# Patient Record
Sex: Male | Born: 1970
Health system: Southern US, Community
[De-identification: ages and names within clinical notes are randomized; demographics above are authoritative.]

## PROBLEM LIST (undated history)

## (undated) DIAGNOSIS — E7800 Pure hypercholesterolemia, unspecified: Secondary | ICD-10-CM

## (undated) DIAGNOSIS — I472 Ventricular tachycardia: Secondary | ICD-10-CM

## (undated) DIAGNOSIS — I4729 Other ventricular tachycardia: Secondary | ICD-10-CM

## (undated) DIAGNOSIS — R778 Other specified abnormalities of plasma proteins: Secondary | ICD-10-CM

## (undated) DIAGNOSIS — R7989 Other specified abnormal findings of blood chemistry: Secondary | ICD-10-CM

## (undated) DIAGNOSIS — I422 Other hypertrophic cardiomyopathy: Secondary | ICD-10-CM

## (undated) HISTORY — PX: KNEE SURGERY: SHX244

---

## 2003-05-07 ENCOUNTER — Encounter: Payer: Self-pay | Admitting: Orthopedic Surgery

## 2003-05-07 ENCOUNTER — Ambulatory Visit (HOSPITAL_COMMUNITY): Admission: RE | Admit: 2003-05-07 | Discharge: 2003-05-07 | Payer: Self-pay | Admitting: Orthopedic Surgery

## 2004-03-06 ENCOUNTER — Emergency Department (HOSPITAL_COMMUNITY): Admission: EM | Admit: 2004-03-06 | Discharge: 2004-03-06 | Payer: Self-pay | Admitting: Family Medicine

## 2004-06-24 ENCOUNTER — Ambulatory Visit (HOSPITAL_COMMUNITY): Admission: RE | Admit: 2004-06-24 | Discharge: 2004-06-24 | Payer: Self-pay | Admitting: Emergency Medicine

## 2004-10-21 ENCOUNTER — Emergency Department (HOSPITAL_COMMUNITY): Admission: EM | Admit: 2004-10-21 | Discharge: 2004-10-21 | Payer: Self-pay | Admitting: Family Medicine

## 2005-11-11 ENCOUNTER — Emergency Department (HOSPITAL_COMMUNITY): Admission: EM | Admit: 2005-11-11 | Discharge: 2005-11-11 | Payer: Self-pay | Admitting: Family Medicine

## 2005-11-14 ENCOUNTER — Emergency Department (HOSPITAL_COMMUNITY): Admission: EM | Admit: 2005-11-14 | Discharge: 2005-11-14 | Payer: Self-pay | Admitting: Emergency Medicine

## 2009-05-08 ENCOUNTER — Encounter: Payer: Self-pay | Admitting: Internal Medicine

## 2011-05-13 ENCOUNTER — Inpatient Hospital Stay (INDEPENDENT_AMBULATORY_CARE_PROVIDER_SITE_OTHER)
Admission: RE | Admit: 2011-05-13 | Discharge: 2011-05-13 | Disposition: A | Discharge status: Home / Self Care | Payer: 59 | Source: Ambulatory Visit | Attending: Emergency Medicine | Admitting: Emergency Medicine

## 2011-05-13 DIAGNOSIS — J309 Allergic rhinitis, unspecified: Secondary | ICD-10-CM

## 2012-12-12 ENCOUNTER — Ambulatory Visit (HOSPITAL_COMMUNITY)
Admission: RE | Admit: 2012-12-12 | Discharge: 2012-12-12 | Disposition: A | Discharge status: Home / Self Care | Payer: 59 | Source: Ambulatory Visit | Attending: Sports Medicine | Admitting: Sports Medicine

## 2012-12-12 ENCOUNTER — Ambulatory Visit (INDEPENDENT_AMBULATORY_CARE_PROVIDER_SITE_OTHER): Payer: 59 | Admitting: Sports Medicine

## 2012-12-12 ENCOUNTER — Encounter: Payer: Self-pay | Admitting: Sports Medicine

## 2012-12-12 VITALS — BP 131/76 | HR 59 | Ht 68.5 in | Wt 150.0 lb

## 2012-12-12 DIAGNOSIS — M7062 Trochanteric bursitis, left hip: Secondary | ICD-10-CM

## 2012-12-12 DIAGNOSIS — M25559 Pain in unspecified hip: Secondary | ICD-10-CM | POA: Insufficient documentation

## 2012-12-12 DIAGNOSIS — M25552 Pain in left hip: Secondary | ICD-10-CM

## 2012-12-12 DIAGNOSIS — M76899 Other specified enthesopathies of unspecified lower limb, excluding foot: Secondary | ICD-10-CM

## 2012-12-12 NOTE — Progress Notes (Signed)
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 Present intermittently for 30 years. Started taekwondo 2 wks ago w/ pain worsened by lateral and high kicks. Wants to prevent worsening pain and any further issues. Hip typically hurts for about 1 hr after. No previous specific trauma other than a possible hard tackle in football at age 17. H/o gymnastics participation through college. He localizes his pain to the lateral hip. Denies groin pain. No low back pain.  Midfoot pain: started 2 wks ago in the arch after starting taekwondo. Runs indoors and barefoot on the wrestling mats to warm up. Running for 10 min. No other running. naprocin x1 w/o much benefit. Painful after run but resolved by morning. No trauma.    The following portions of the patient's history were reviewed and updated as appropriate: allergies, current medications and problem list.   Review of Systems ROS Per HPI otherwise negative.   Objective:    BP 131/76  Pulse 59  Ht 5' 8.5" (1.74 m)  Wt 150 lb (68.04 kg)  BMI 22.47 kg/m2 Gen: NAD, WNWD HEENT: MMM, EOMI SKin: warm, well perfused, intact Neuro: CN grossly intact. Ambulation w/o difficulty Left hip: Smooth painless hip range of motion with a negative log roll. There is tenderness to palpation over the left greater trochanter as well as a little more proximally in the tensor fascia lata. No soft tissue swelling. He has hip weakness bilaterally with resisted hip abduction. Fairly good strength with resisted hip flexion. Neurovascularly intact distally. Examination of each of his feet shows there to be minimal tenderness to palpation along the mid substance of the plantar fascia bilaterally. Her is no soft tissue swelling. There is no palpable fibroma. No tenderness to palpation at the calcaneal insertion. Neurovascularly intact distally. Walking without a limp.    Imaging: X-ray Ordered    ssessment:   1.  left hip pain secondary to greater trochanteric bursitis/proximal IT band tendinitis 2. Plantar fascial pain  Plan:   1. with questionable history of remote trauma we will order an x-ray of the left hip. 2. Home exercises (especially abductor strengthening) reviewed and to be performed Qday to BID 3. No restriction on activity 4. Consider Steroid injection of hip if no improvement. 5. Arch straps. I recommended be worn with tae kwon do. 6. I will call the patient after reviewing his x-rays. We'll followup formally prn. 6. F/u in 3 wks if no improvement or worsening.

## 2012-12-13 ENCOUNTER — Telehealth: Payer: Self-pay | Admitting: Sports Medicine

## 2012-12-13 NOTE — Telephone Encounter (Signed)
I spoke with Dr. Mayford Knife on the phone today regarding x-rays of his left hip. They're unremarkable. No evidence of degenerative changes or AVN. He we'll proceed with our treatment plan as discussed at his office visit yesterday.

## 2013-08-24 ENCOUNTER — Other Ambulatory Visit: Payer: Self-pay

## 2013-12-26 ENCOUNTER — Other Ambulatory Visit (HOSPITAL_COMMUNITY): Payer: Self-pay | Admitting: Orthopedic Surgery

## 2013-12-26 DIAGNOSIS — S46219A Strain of muscle, fascia and tendon of other parts of biceps, unspecified arm, initial encounter: Secondary | ICD-10-CM

## 2013-12-27 ENCOUNTER — Ambulatory Visit (HOSPITAL_COMMUNITY)
Admission: RE | Admit: 2013-12-27 | Discharge: 2013-12-27 | Disposition: A | Discharge status: Home / Self Care | Payer: 59 | Source: Ambulatory Visit | Attending: Orthopedic Surgery | Admitting: Orthopedic Surgery

## 2013-12-27 DIAGNOSIS — M25519 Pain in unspecified shoulder: Secondary | ICD-10-CM | POA: Insufficient documentation

## 2013-12-27 DIAGNOSIS — S46219A Strain of muscle, fascia and tendon of other parts of biceps, unspecified arm, initial encounter: Secondary | ICD-10-CM

## 2013-12-28 ENCOUNTER — Ambulatory Visit (HOSPITAL_COMMUNITY): Payer: 59

## 2014-01-09 ENCOUNTER — Ambulatory Visit: Payer: 59 | Attending: Orthopedic Surgery | Admitting: Physical Therapy

## 2014-01-09 DIAGNOSIS — M25519 Pain in unspecified shoulder: Secondary | ICD-10-CM | POA: Insufficient documentation

## 2014-01-09 DIAGNOSIS — IMO0001 Reserved for inherently not codable concepts without codable children: Secondary | ICD-10-CM | POA: Insufficient documentation

## 2014-01-16 ENCOUNTER — Ambulatory Visit: Payer: 59 | Admitting: Physical Therapy

## 2014-01-16 ENCOUNTER — Ambulatory Visit: Payer: 59 | Admitting: Rehabilitation

## 2014-01-24 ENCOUNTER — Ambulatory Visit: Payer: 59 | Attending: Orthopedic Surgery | Admitting: Rehabilitation

## 2014-01-24 DIAGNOSIS — M25519 Pain in unspecified shoulder: Secondary | ICD-10-CM | POA: Insufficient documentation

## 2014-01-24 DIAGNOSIS — IMO0001 Reserved for inherently not codable concepts without codable children: Secondary | ICD-10-CM | POA: Insufficient documentation

## 2014-02-01 ENCOUNTER — Ambulatory Visit: Payer: 59 | Admitting: Rehabilitation

## 2014-02-19 ENCOUNTER — Ambulatory Visit: Payer: 59 | Attending: Orthopedic Surgery | Admitting: Rehabilitation

## 2014-02-19 DIAGNOSIS — IMO0001 Reserved for inherently not codable concepts without codable children: Secondary | ICD-10-CM | POA: Insufficient documentation

## 2014-02-19 DIAGNOSIS — M25519 Pain in unspecified shoulder: Secondary | ICD-10-CM | POA: Insufficient documentation

## 2014-08-03 ENCOUNTER — Other Ambulatory Visit: Payer: Self-pay

## 2014-12-02 ENCOUNTER — Emergency Department (HOSPITAL_COMMUNITY)
Admission: EM | Admit: 2014-12-02 | Discharge: 2014-12-02 | Disposition: A | Discharge status: Home / Self Care | Payer: 59 | Source: Home / Self Care | Attending: Emergency Medicine | Admitting: Emergency Medicine

## 2014-12-02 ENCOUNTER — Encounter (HOSPITAL_COMMUNITY): Payer: Self-pay | Admitting: *Deleted

## 2014-12-02 DIAGNOSIS — M542 Cervicalgia: Secondary | ICD-10-CM

## 2014-12-02 HISTORY — DX: Pure hypercholesterolemia, unspecified: E78.00

## 2014-12-02 MED ORDER — CEPHALEXIN 500 MG PO CAPS: 500.0000 mg | ORAL_CAPSULE | Freq: Three times a day (TID) | ORAL | Status: DC

## 2014-12-02 MED ORDER — PREDNISONE 20 MG PO TABS: ORAL_TABLET | ORAL | Status: DC

## 2014-12-02 NOTE — ED Provider Notes (Signed)
   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 It's tender to touch. He thinks it might be a little bit swollen. He cannot palpate any mass, lump, or adenopathy. The patient had an ingrown hair in his left cheek a few days before this began. This is not going down. He denies any fever, chills, headache, stiff neck, sore throat, intraoral lesions, nasal congestion, or earache. There is no radiation the pain down his arms with no numbness, tingling, or weakness. He denies any chest pain, shortness of breath, or cough.  Review of Systems   Other than noted above, the patient denies any of the following symptoms: Constitutional:  No fever or chills. Neck:  No swelling, or adenopathy.   Cardiac:  No chest pain, tightness, or pressure. Respiratory:  No cough or dyspnea. M-S:  No joint pain, muscle pain, or back problems. Neuro:  No headache, muscle weakness, or paresthesias.  PMFSH   Past medical history, family history, social history, meds, and allergies were reviewed.    Physical Examination    Vital signs:  BP 138/79 mmHg  Pulse 55  Temp(Src) 97.8 F (36.6 C) (Oral)  Resp 14  SpO2 100% General:  Alert, oriented and in no distress. ENT:  Pharynx clear, no oral lesions. Neck:  He has exquisite tenderness to touch over the carotid bulb. Carotid pulsations are full bilaterally. There is no adenopathy or mass. Neck has a full range of motion.  Lungs:  No respiratory distress.  Breath sounds clear and equal bilaterally.  No wheezes, rales or rhonchi. Heart:  Regular rhythm.  No gallops, murmers, or rubs. Ext:  No upper extremity edema, pulses full.  Full ROM of joints with no joint or muscle pain to palpation. Neuro:  Alert and oriented times 3.  No focal muscle weakness.  DTRs  symmetric.  Sensation intact to light touch. Skin: Clear, warm and dry.  No rash.  Good capillary refill.  Assessment   The encounter diagnosis was Neck pain.  Differential diagnosis is carotid dyspnea versus lymphadenitis. I'll treat for both. I told him if this hasn't improved in 3 or 4 days the next step would be to get a neck CT.  Plan    1.  Meds:  The following meds were prescribed:   Discharge Medication List as of 12/02/2014  3:35 PM    START taking these medications   Details  cephALEXin (KEFLEX) 500 MG capsule Take 1 capsule (500 mg total) by mouth 3 (three) times daily., Starting 12/02/2014, Until Discontinued, Normal    predniSONE (DELTASONE) 20 MG tablet Take 3 daily for 5 days, 2 daily for 5 days, 1 daily for 5 days., Normal        2.  Patient Education/Counseling:  The patient was given appropriate handouts, self care instructions, and instructed in pain control.  Given exercises to do twice daily followed by moist heat.    3.  Follow up:  The patient was told to follow up here if no better in 3 to 4 days, or sooner if becoming worse in any way, and given some red flag symptoms such as worsening pain or new neurological symptoms which would prompt immediate return.       Reuben Likesavid C Shaianne Nucci, MD 12/02/14 [phone]

## 2014-12-02 NOTE — ED Notes (Signed)
Reports having had ingrown hair to left cheek approx 1.5 wks ago with improvement.  Today having a pinpoint area of tenderness to left lateral neck.  Pain significantly worse with any movement or palpation.  Denies fevers.

## 2014-12-02 NOTE — Discharge Instructions (Signed)
Cervical Adenitis °You have a swollen lymph gland in your neck. This commonly happens with Strep and virus infections, dental problems, insect bites, and injuries about the face, scalp, or neck. The lymph glands swell as the body fights the infection or heals the injury. Swelling and firmness typically lasts for several weeks after the infection or injury is healed. Rarely lymph glands can become swollen because of cancer or TB. °Antibiotics are prescribed if there is evidence of an infection. Sometimes an infected lymph gland becomes filled with pus. This condition may require opening up the abscessed gland by draining it surgically. Most of the time infected glands return to normal within two weeks. Do not poke or squeeze the swollen lymph nodes. That may keep them from shrinking back to their normal size. If the lymph gland is still swollen after 2 weeks, further medical evaluation is needed.  °SEEK IMMEDIATE MEDICAL CARE IF:  °You have difficulty swallowing or breathing, increased swelling, severe pain, or a high fever.  °Document Released: 10/05/2005 Document Revised: 12/28/2011 Document Reviewed: 03/27/2007 °ExitCare® Patient Information ©2015 [employer]. This information is not intended to replace advice given to you by your health care provider. Make sure you discuss any questions you have with your health care provider. ° °

## 2015-11-08 ENCOUNTER — Observation Stay (HOSPITAL_COMMUNITY)
Admission: EM | Admit: 2015-11-08 | Discharge: 2015-11-09 | Disposition: A | Discharge status: Home / Self Care | Payer: 59 | Attending: Internal Medicine | Admitting: Internal Medicine

## 2015-11-08 ENCOUNTER — Emergency Department (HOSPITAL_BASED_OUTPATIENT_CLINIC_OR_DEPARTMENT_OTHER): Payer: 59

## 2015-11-08 ENCOUNTER — Encounter (HOSPITAL_COMMUNITY): Payer: Self-pay

## 2015-11-08 ENCOUNTER — Emergency Department (HOSPITAL_COMMUNITY): Payer: 59

## 2015-11-08 DIAGNOSIS — I472 Ventricular tachycardia: Principal | ICD-10-CM | POA: Insufficient documentation

## 2015-11-08 DIAGNOSIS — E7800 Pure hypercholesterolemia, unspecified: Secondary | ICD-10-CM | POA: Diagnosis not present

## 2015-11-08 DIAGNOSIS — I442 Atrioventricular block, complete: Secondary | ICD-10-CM

## 2015-11-08 DIAGNOSIS — R079 Chest pain, unspecified: Secondary | ICD-10-CM | POA: Diagnosis not present

## 2015-11-08 DIAGNOSIS — R748 Abnormal levels of other serum enzymes: Secondary | ICD-10-CM | POA: Insufficient documentation

## 2015-11-08 DIAGNOSIS — I4729 Other ventricular tachycardia: Secondary | ICD-10-CM

## 2015-11-08 DIAGNOSIS — Z79899 Other long term (current) drug therapy: Secondary | ICD-10-CM | POA: Diagnosis not present

## 2015-11-08 DIAGNOSIS — R0602 Shortness of breath: Secondary | ICD-10-CM | POA: Diagnosis not present

## 2015-11-08 DIAGNOSIS — R9431 Abnormal electrocardiogram [ECG] [EKG]: Secondary | ICD-10-CM | POA: Diagnosis present

## 2015-11-08 DIAGNOSIS — R778 Other specified abnormalities of plasma proteins: Secondary | ICD-10-CM

## 2015-11-08 DIAGNOSIS — R0600 Dyspnea, unspecified: Secondary | ICD-10-CM

## 2015-11-08 DIAGNOSIS — I499 Cardiac arrhythmia, unspecified: Secondary | ICD-10-CM | POA: Diagnosis not present

## 2015-11-08 DIAGNOSIS — Z8249 Family history of ischemic heart disease and other diseases of the circulatory system: Secondary | ICD-10-CM | POA: Insufficient documentation

## 2015-11-08 DIAGNOSIS — I498 Other specified cardiac arrhythmias: Secondary | ICD-10-CM | POA: Insufficient documentation

## 2015-11-08 DIAGNOSIS — I422 Other hypertrophic cardiomyopathy: Secondary | ICD-10-CM | POA: Insufficient documentation

## 2015-11-08 DIAGNOSIS — R7989 Other specified abnormal findings of blood chemistry: Secondary | ICD-10-CM

## 2015-11-08 DIAGNOSIS — R55 Syncope and collapse: Secondary | ICD-10-CM

## 2015-11-08 HISTORY — DX: Other hypertrophic cardiomyopathy: I42.2

## 2015-11-08 HISTORY — DX: Other specified abnormal findings of blood chemistry: R79.89

## 2015-11-08 HISTORY — DX: Other specified abnormalities of plasma proteins: R77.8

## 2015-11-08 HISTORY — DX: Other ventricular tachycardia: I47.29

## 2015-11-08 HISTORY — DX: Ventricular tachycardia: I47.2

## 2015-11-08 LAB — I-STAT TROPONIN, ED: TROPONIN I, POC: 0.05 ng/mL (ref 0.00–0.08)

## 2015-11-08 LAB — BASIC METABOLIC PANEL
ANION GAP: 10 (ref 5–15)
BUN: 9 mg/dL (ref 6–20)
CALCIUM: 9.1 mg/dL (ref 8.9–10.3)
CO2: 27 mmol/L (ref 22–32)
CREATININE: 1.15 mg/dL (ref 0.61–1.24)
Chloride: 106 mmol/L (ref 101–111)
GFR calc Af Amer: >60 mL/min (ref >60)
Glucose, Bld: 93 mg/dL (ref 65–99)
Potassium: 3.7 mmol/L (ref 3.5–5.1)
Sodium: 143 mmol/L (ref 135–145)

## 2015-11-08 LAB — I-STAT CHEM 8, ED
BUN: 11 mg/dL (ref 6–20)
CHLORIDE: 104 mmol/L (ref 101–111)
CREATININE: 1.1 mg/dL (ref 0.61–1.24)
Calcium, Ion: 1.18 mmol/L (ref 1.12–1.23)
GLUCOSE: 79 mg/dL (ref 65–99)
HEMATOCRIT: 47 % (ref 39.0–52.0)
Hemoglobin: 16 g/dL (ref 13.0–17.0)
Potassium: 3.6 mmol/L (ref 3.5–5.1)
Sodium: 144 mmol/L (ref 135–145)
TCO2: 28 mmol/L (ref 0–100)

## 2015-11-08 LAB — CBC
HCT: 43.1 % (ref 39.0–52.0)
Hemoglobin: 14.8 g/dL (ref 13.0–17.0)
MCH: 30.4 pg (ref 26.0–34.0)
MCHC: 34.3 g/dL (ref 30.0–36.0)
MCV: 88.5 fL (ref 78.0–100.0)
Platelets: 225 10*3/uL (ref 150–400)
RBC: 4.87 MIL/uL (ref 4.22–5.81)
RDW: 12.7 % (ref 11.5–15.5)
WBC: 4.8 10*3/uL (ref 4.0–10.5)

## 2015-11-08 LAB — TROPONIN I
TROPONIN I: 0.11 ng/mL — AB (ref <0.031)
Troponin I: 0.1 ng/mL — ABNORMAL HIGH (ref <0.031)

## 2015-11-08 LAB — PHOSPHORUS: PHOSPHORUS: 3.4 mg/dL (ref 2.5–4.6)

## 2015-11-08 LAB — MAGNESIUM: Magnesium: 1.9 mg/dL (ref 1.7–2.4)

## 2015-11-08 MED ORDER — NITROGLYCERIN 0.4 MG SL SUBL: 0.4000 mg | SUBLINGUAL_TABLET | SUBLINGUAL | Status: DC | PRN

## 2015-11-08 MED ORDER — SODIUM CHLORIDE 0.9 % IJ SOLN: 3.0000 mL | Freq: Two times a day (BID) | INTRAMUSCULAR | Status: DC

## 2015-11-08 MED ORDER — NITROGLYCERIN 0.4 MG SL SUBL
0.4000 mg | SUBLINGUAL_TABLET | SUBLINGUAL | Status: DC | PRN
  Administered 2015-11-08: 0.4 mg via SUBLINGUAL
  Filled 2015-11-08: qty 1

## 2015-11-08 MED ORDER — ASPIRIN 81 MG PO CHEW
324.0000 mg | CHEWABLE_TABLET | ORAL | Status: DC
  Filled 2015-11-08: qty 4

## 2015-11-08 MED ORDER — SODIUM CHLORIDE 0.9 % IJ SOLN: 3.0000 mL | INTRAMUSCULAR | Status: DC | PRN

## 2015-11-08 MED ORDER — ASPIRIN EC 81 MG PO TBEC
81.0000 mg | DELAYED_RELEASE_TABLET | Freq: Every day | ORAL | Status: DC
  Administered 2015-11-09: 81 mg via ORAL

## 2015-11-08 MED ORDER — ASPIRIN 300 MG RE SUPP: 300.0000 mg | RECTAL | Status: DC

## 2015-11-08 MED ORDER — SODIUM CHLORIDE 0.9 % IV SOLN: 250.0000 mL | INTRAVENOUS | Status: DC | PRN

## 2015-11-08 MED ORDER — ASPIRIN 81 MG PO CHEW
324.0000 mg | CHEWABLE_TABLET | Freq: Once | ORAL | Status: AC
  Administered 2015-11-08: 324 mg via ORAL
  Filled 2015-11-08: qty 4

## 2015-11-08 MED ORDER — ONDANSETRON HCL 4 MG/2ML IJ SOLN: 4.0000 mg | Freq: Four times a day (QID) | INTRAMUSCULAR | Status: DC | PRN

## 2015-11-08 MED ORDER — ACETAMINOPHEN 325 MG PO TABS: 650.0000 mg | ORAL_TABLET | ORAL | Status: DC | PRN

## 2015-11-08 NOTE — ED Notes (Signed)
Pt reports left sided chest pain that is described as sharp and intermittent.  Pt reports when the chest pain hits he becomes lightheaded, nauseous and "like the flu is coming on".  Pt hooked himself to monitor upstairs and had a five beat run of v-tach.  Pt NSR at this time.

## 2015-11-08 NOTE — Progress Notes (Signed)
Echocardiogram 2D Echocardiogram has been performed.  11/08/2015 4:16 PM Gertie Fey, RVT, RDCS, RDMS

## 2015-11-08 NOTE — ED Provider Notes (Signed)
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 He's had some intermittent chest pain and he felt lightheaded.  He was upstairs today working when he felt the symptoms again. He hooked himself up to the monitor and noticed a 5 beat run of wide complex rhythm. He did not have any loss of consciousness. Denying any current trouble with any chest pain or shortness of breath.  Emergency for further evaluation Physical Exam  BP 133/88 mmHg  Pulse 58  Temp(Src) 98.2 F (36.8 C) (Oral)  Resp 18  Ht  (1.753 m)  Wt 68.04 kg  BMI 22.14 kg/m2  SpO2 100%  Physical Exam  Constitutional: He appears well-developed and well-nourished. No distress.  HENT:  Head: Normocephalic and atraumatic.  Right Ear: External ear normal.  Left Ear: External ear normal.  Eyes: Conjunctivae are normal. Right eye exhibits no discharge. Left eye exhibits no discharge. No scleral icterus.  Neck: Neck supple. No tracheal deviation present.  Cardiovascular: Normal rate.   Pulmonary/Chest: Effort normal. No stridor. No respiratory distress.  Musculoskeletal: He exhibits no edema.  Neurological: He is alert. Cranial nerve deficit: no gross deficits.  Skin: Skin is warm and dry. No rash noted.  Psychiatric: He has a normal mood and affect.  Nursing note and vitals reviewed.   ED Course  Procedures  EKG Interpretation  Date/Time:  Friday November 08 2015 14:08:18 EST Ventricular Rate:  60 PR Interval:  147 QRS Duration: 116 QT Interval:  413 QTC Calculation: 413 R Axis:   52 Text Interpretation:  Sinus rhythm Probable LVH with secondary repol abnrm No previous tracing Confirmed by Jericho Alcorn  MD-J, Darenda Fike ([id number]) on 11/08/2015 2:43:02 PM       MDM The rhythm strip was reviewed. Patient had a wide complex rhythm that was not tachycardic. I'm not certain of the exact rate but it seemed to be only slightly faster than his native sinus rhythm  of approx 60 bpm.    EKG shows nonspecific changes. Initial laboratory tests are unremarkable. Plan on cardiac consultation for further evaluation.      Linwood Dibbles, MD 11/08/15 [phone]

## 2015-11-08 NOTE — ED Provider Notes (Signed)
Care assumed at 3 PM. This is a 45 y/o male who presents with multiple PVCs which is symptomatic from. Cardiology consultation to evaluate the patient. Cards will admit to their service for observation and further testing. No acute events in the ER.  Silas Flood, MD 11/09/15 0003  Abelino Derrick, MD 11/09/15 [id number]

## 2015-11-08 NOTE — ED Provider Notes (Signed)
CSN: [id number]     Arrival date & time 11/08/15  1359 History   First MD Initiated Contact with Patient 11/08/15 1405     Chief Complaint  Patient presents with  . Chest Pain  . Irregular Heart Beat     (Consider location/radiation/quality/duration/timing/severity/associated sxs/prior Treatment) Patient is a 45 y.o. male presenting with chest pain. The history is provided by the patient.  Chest Pain Pain location:  L lateral chest Pain quality: sharp and stabbing   Pain radiates to:  Does not radiate Pain radiates to the back: no   Pain severity:  Mild Onset quality:  Sudden Duration:  3 days Timing:  Intermittent Progression:  Waxing and waning Chronicity:  New Context comment:  Playing basketball tues night. Relieved by:  None tried Worsened by:  Nothing tried Ineffective treatments:  None tried Associated symptoms: fatigue, nausea and shortness of breath   Associated symptoms: no altered mental status, no syncope and not vomiting   Associated symptoms comment:  Reports 1 day of diaphoresis, nausea, and some SOB Risk factors: male sex     Past Medical History  Diagnosis Date  . Hypercholesterolemia    Past Surgical History  Procedure Laterality Date  . Knee surgery     Family History  Problem Relation Age of Onset  . Heart disease Father   . Hypertension Mother   . Diabetes Father   . Hypertension Father   . Hypertension Sister    Social History  Substance Use Topics  . Smoking status: Never Smoker   . Smokeless tobacco: Never Used  . Alcohol Use: Yes     Comment: occasional    Review of Systems  Constitutional: Positive for fatigue.  HENT: Negative.   Eyes: Negative for visual disturbance.  Respiratory: Positive for shortness of breath.   Cardiovascular: Positive for chest pain. Negative for syncope.  Gastrointestinal: Positive for nausea. Negative for vomiting and diarrhea.  Genitourinary: Negative.   Musculoskeletal: Negative.   Skin: Negative.    Neurological: Negative for syncope.      Allergies  Review of patient's allergies indicates no known allergies.  Home Medications   Prior to Admission medications   Medication Sig Start Date End Date Taking? Authorizing Provider  atorvastatin (LIPITOR) 10 MG tablet Take 10 mg by mouth daily.   Yes Historical Provider, MD  fluticasone (FLONASE) 50 MCG/ACT nasal spray Place 2 sprays into both nostrils daily as needed for allergies or rhinitis.   Yes Historical Provider, MD   BP 142/82 mmHg  Pulse 62  Temp(Src) 97.8 F (36.6 C) (Oral)  Resp 18  Ht  (1.753 m)  Wt 68.04 kg  BMI 22.14 kg/m2  SpO2 100% Physical Exam  Constitutional: He is oriented to person, place, and time. He appears well-developed and well-nourished. No distress.  HENT:  Head: Normocephalic and atraumatic.  Mouth/Throat: No oropharyngeal exudate.  Eyes: Pupils are equal, round, and reactive to light. No scleral icterus.  Neck: Normal range of motion.  Cardiovascular: Normal rate, regular rhythm and intact distal pulses.  Exam reveals no gallop and no friction rub.   No murmur heard. Pulmonary/Chest: Effort normal. No respiratory distress. He has no wheezes. He has no rales.  Abdominal: Soft. He exhibits no distension. There is no tenderness. There is no rebound and no guarding.  Musculoskeletal: Normal range of motion. He exhibits no edema or tenderness.  Neurological: He is alert and oriented to person, place, and time. No cranial nerve deficit. He exhibits normal muscle tone.  Coordination normal.  Skin: Skin is warm and dry. No rash noted. He is not diaphoretic. No erythema. No pallor.  Psychiatric: He has a normal mood and affect.  Nursing note and vitals reviewed.   ED Course  Procedures (including critical care time) Labs Review Labs Reviewed  TROPONIN I - Abnormal; Notable for the following:    Troponin I 0.11 (*)    All other components within normal limits  TROPONIN I - Abnormal; Notable  for the following:    Troponin I 0.10 (*)    All other components within normal limits  CBC  MAGNESIUM  PHOSPHORUS  BASIC METABOLIC PANEL  LIPID PANEL  TROPONIN I  TROPONIN I  I-STAT CHEM 8, ED  I-STAT TROPOININ, ED    Imaging Review Dg Chest 2 View  11/08/2015  CLINICAL DATA:  Shortness of breath and PVCs for 24 hours. EXAM: CHEST  2 VIEW COMPARISON:  06/24/2004 FINDINGS: Normal heart size and mediastinal contours. No acute infiltrate or edema. No effusion or pneumothorax. No acute osseous findings. IMPRESSION: Negative chest. Electronically Signed   By: Marnee Spring M.D.   On: 11/08/2015 14:47   I have personally reviewed and evaluated these images and lab results as part of my medical decision-making.   EKG Interpretation   Date/Time:  Friday November 08 2015 14:08:18 EST Ventricular Rate:  60 PR Interval:  147 QRS Duration: 116 QT Interval:  413 QTC Calculation: 413 R Axis:   52 Text Interpretation:  Sinus rhythm Probable LVH with secondary repol abnrm  No previous tracing Confirmed by KNAPP  MD-J, JON ([id number]) on 11/08/2015  2:43:02 PM      MDM   Final diagnoses:  Cardiac arrhythmia, unspecified cardiac arrhythmia type  Near syncope    Patient is a 45 year old male with PMHx of hyperlipidemia who presents with 1 day of shortness of breath, diaphoresis, nausea, with some intermittent left-sided sharp chest pain of which has been going on since playing basketball on Tuesday night. 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 This was associated with presyncopal symptoms so he came to the ED for further evaluation. CBC, BMP, mag, and Phos, within normal limits. Troponin 0.05. Chest x-ray without acute findings. EKG with inferior twave inversions and ST depression in V4, V5, and V6 without any prior EKG for comparison. Cardiology consult. Will trend trop.   Patient care handed over to Dr. Edwyna Perfect at 5pm.  F/u trop trend. Likely admit to cardiology   Marijean Niemann, MD 11/08/15 3086  Linwood Dibbles, MD 11/09/15 [phone]

## 2015-11-08 NOTE — H&P (Signed)
Patient ID: Bradley Castaneda MRN: [id number], DOB/AGE: [birth year]   Admit date: 11/08/2015   Primary Physician: Dr Selena Batten Primary Cardiologist: New   WUJ:WJXBJ Bradley Castaneda is a 45 y.o. male with past medical history of HL who presents to [facility] ED on 11/08/2015 for chest pain, nausea, SOB and fatigue which started earlier today.  The patient reports he was playing basketball on Tuesday, 11/05/2015, and developed chest pain afterwards which was reproducible with palpation. He did not have any other symptoms at that time and thought it was likely of MSK etiology.  He plays regularly    Since then, he reports feeling fatigued for the past two days and felt "like he was getting the flu". 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 Last night he felt intermitt chest pains  Lasted seconds  This afternoon, he felt nauseated and had an episode of chest pain  Fleeting discomport. He reports the chest pain is a shooting pain, lasting 3-4 second intervals.  Also felt a little SOB  He connected himself to telemetry and had 5 beats of wide-complex tachycardia  And then 3 beat which he was able to capture on his phone.  In the ED, he reports having minor left-sided chest pain which is not reproducible  Last seconds  . He denies any other symptoms at this time.  Initial i-stat troponin is 0.05. Says he had an EKG and Echocardiogram as a child and 6+ years ago. Reports his EKG has always shown LVH and T-wave abnormalities. Done at Dr Reuel Derby office   Reports his father had CAD at an early age but is now deceased. ? Hypertrophic  Reports a family history of Type 2 DM and HTN, but neither have been diagnosed in him personally.    Home Medications  Prior to Admission medications   Medication Sig Start Date End Date Taking? Authorizing Provider  atorvastatin (LIPITOR) 10 MG tablet Take 10 mg by mouth daily.    Historical Provider, MD  cephALEXin (KEFLEX) 500 MG capsule Take 1 capsule  (500 mg total) by mouth 3 (three) times daily. 12/02/14   Reuben Likes, MD  predniSONE (DELTASONE) 20 MG tablet Take 3 daily for 5 days, 2 daily for 5 days, 1 daily for 5 days. 12/02/14   Reuben Likes, MD    Allergies No Known Allergies  Past Medical History Past Medical History  Diagnosis Date  . Hypercholesterolemia      Surgical History Past Surgical History  Procedure Laterality Date  . Knee surgery       Family History Family History  Problem Relation Age of Onset  . Heart disease Mother   . Hypertension Mother   . Diabetes Father   . Hypertension Father   . Hypertension Sister     Social History Social History   Social History  . Marital Status: Married    Spouse Name: N/A  . Number of Children: N/A  . Years of Education: N/A   Occupational History  . Not on file.   Social History Main Topics  . Smoking status: Never Smoker   . Smokeless tobacco: Never Used  . Alcohol Use: Yes     Comment: occasional  . Drug Use: No  . Sexual Activity: Not on file   Other Topics Concern  . Not on file   Social History Narrative     Review of Systems General:  No chills, fever, night sweats or weight  changes. Positive for fatigue. Cardiovascular:  No dyspnea on exertion, edema, orthopnea, palpitations, paroxysmal nocturnal dyspnea. Positive for chest pain. Dermatological: No rash, lesions/masses Respiratory: No cough, dyspnea Urologic: No hematuria, dysuria Abdominal:   No diarrhea, bright red blood per rectum, melena, or hematemesis. Positive for nausea and vomiting. Neurologic:  No visual changes, wkns, changes in mental status. All other systems reviewed and are otherwise negative except as noted above.   Physical Exam: Blood pressure 133/88, pulse 58, temperature 98.2 F (36.8 C), temperature source Oral, resp. rate 18, height  (1.753 m), weight 150 lb (68.04 kg), SpO2 100 %.  General: Well developed, well nourished African American,male appearing  in no acute distress. Head: Normocephalic, atraumatic, sclera non-icteric, no xanthomas, nares are without discharge. Dentition:  Neck: No carotid bruits. JVD not elevated.  Lungs: Respirations regular and unlabored, without wheezes or rales.  Heart: Regular rate and rhythm. No S3 or S4.  No murmur, no rubs, or gallops appreciated. Abdomen: Soft, non-tender, non-distended with normoactive bowel sounds. No hepatomegaly. No rebound/guarding. No obvious abdominal masses. Msk:  Strength and tone appear normal for age. No joint deformities or effusions. Extremities: No clubbing or cyanosis. No edema.  Distal pedal pulses are 2+ bilaterally. Neuro: Alert and oriented X 3. Moves all extremities spontaneously. No focal deficits noted. Psych:  Responds to questions appropriately with a normal affect. Skin: No rashes or lesions noted   Labs Lab Results  Component Value Date   WBC 4.8 11/08/2015   HGB 16.0 11/08/2015   HCT 47.0 11/08/2015   MCV 88.5 11/08/2015   PLT 225 11/08/2015    Recent Labs Lab 11/08/15 1427  NA 144  K 3.6  CL 104  BUN 11  CREATININE 1.10  GLUCOSE 79   Troponin (Point of Care Test)  Recent Labs  11/08/15 1425  TROPIPOC 0.05     ECG: NSR, HR 60. Inferior T-wave inversion. ST depression in V4, V5, and V6.  Echo: Pending  Radiology/Studies: Dg Chest 2 View: 11/08/2015  CLINICAL DATA:  Shortness of breath and PVCs for 24 hours. EXAM: CHEST  2 VIEW COMPARISON:  06/24/2004 FINDINGS: Normal heart size and mediastinal contours. No acute infiltrate or edema. No effusion or pneumothorax. No acute osseous findings. IMPRESSION: Negative chest. Electronically Signed   By: Marnee Spring M.D.   On: 11/08/2015 14:47    ASSESSMENT AND PLAN:   1. Chest Pain in setting of abnormal EKG - patient developed episode of chest pain earlier today, associated with nausea and fatigue.  Atypical  Fleeting  Reports his pain is a shooting pain which lasts several seconds. Did record 8  beats of NSVT on his cell phone after connecting himself to telemetry. - EKG shows inferior TWI and ST depression in V4, V5, and V6.  Has been abnormal for years  Patient reports a history of abnormal EKG's with LVH and T-wave abnormalities. Attempted to contact his PCP (Dr Selena Batten) but last EKG was 8 years ago and was in a paper chart, not available for review. - initial troponin negative. Repeat value pending.   Echo shows normal LV systolic function with apical hypertrophy   Signed, Ellsworth Lennox, PA-C 11/08/2015, 3:05 PM Pager: [phone]  Pt seen and examined  I have reviewed echo with colleagues Evid of apical hypertrophy I have reviewed case with Odessa Fleming.  He will plan to see the pt tomorrow Will continue telemetry  Cycle enzymes though I am not convinced of acute ischemia With baseline bradycardia pt  will prob not tolerate b blocker. Pt agreeable with above plan.  Dietrich Pates

## 2015-11-09 ENCOUNTER — Encounter (HOSPITAL_COMMUNITY): Payer: Self-pay | Admitting: Internal Medicine

## 2015-11-09 ENCOUNTER — Observation Stay (HOSPITAL_COMMUNITY): Payer: 59

## 2015-11-09 DIAGNOSIS — R079 Chest pain, unspecified: Secondary | ICD-10-CM | POA: Diagnosis not present

## 2015-11-09 DIAGNOSIS — I499 Cardiac arrhythmia, unspecified: Secondary | ICD-10-CM

## 2015-11-09 DIAGNOSIS — I442 Atrioventricular block, complete: Secondary | ICD-10-CM

## 2015-11-09 DIAGNOSIS — Z79899 Other long term (current) drug therapy: Secondary | ICD-10-CM | POA: Diagnosis not present

## 2015-11-09 DIAGNOSIS — I472 Ventricular tachycardia: Secondary | ICD-10-CM | POA: Diagnosis not present

## 2015-11-09 DIAGNOSIS — E7800 Pure hypercholesterolemia, unspecified: Secondary | ICD-10-CM | POA: Diagnosis not present

## 2015-11-09 DIAGNOSIS — I498 Other specified cardiac arrhythmias: Secondary | ICD-10-CM | POA: Diagnosis not present

## 2015-11-09 DIAGNOSIS — I422 Other hypertrophic cardiomyopathy: Secondary | ICD-10-CM | POA: Diagnosis not present

## 2015-11-09 DIAGNOSIS — R7989 Other specified abnormal findings of blood chemistry: Secondary | ICD-10-CM

## 2015-11-09 DIAGNOSIS — Z8249 Family history of ischemic heart disease and other diseases of the circulatory system: Secondary | ICD-10-CM | POA: Diagnosis not present

## 2015-11-09 DIAGNOSIS — R748 Abnormal levels of other serum enzymes: Secondary | ICD-10-CM | POA: Diagnosis not present

## 2015-11-09 DIAGNOSIS — R778 Other specified abnormalities of plasma proteins: Secondary | ICD-10-CM

## 2015-11-09 LAB — LIPID PANEL
CHOL/HDL RATIO: 3.1 ratio
Cholesterol: 147 mg/dL (ref 0–200)
HDL: 47 mg/dL (ref >40)
LDL CALC: 81 mg/dL (ref 0–99)
Triglycerides: 94 mg/dL (ref <150)
VLDL: 19 mg/dL (ref 0–40)

## 2015-11-09 LAB — TROPONIN I
TROPONIN I: 0.09 ng/mL — AB (ref <0.031)
TROPONIN I: 0.14 ng/mL — AB (ref <0.031)
Troponin I: 0.1 ng/mL — ABNORMAL HIGH (ref <0.031)

## 2015-11-09 LAB — BASIC METABOLIC PANEL
Anion gap: 8 (ref 5–15)
BUN: 9 mg/dL (ref 6–20)
CALCIUM: 9 mg/dL (ref 8.9–10.3)
CO2: 28 mmol/L (ref 22–32)
CREATININE: 1.25 mg/dL — AB (ref 0.61–1.24)
Chloride: 107 mmol/L (ref 101–111)
GFR calc Af Amer: >60 mL/min (ref >60)
GLUCOSE: 92 mg/dL (ref 65–99)
Potassium: 3.9 mmol/L (ref 3.5–5.1)
SODIUM: 143 mmol/L (ref 135–145)

## 2015-11-09 LAB — MAGNESIUM: MAGNESIUM: 1.8 mg/dL (ref 1.7–2.4)

## 2015-11-09 MED ORDER — IOHEXOL 350 MG/ML SOLN
100.0000 mL | Freq: Once | INTRAVENOUS | Status: AC | PRN
  Administered 2015-11-09: 100 mL via INTRAVENOUS

## 2015-11-09 MED ORDER — ASPIRIN 81 MG PO TBEC: 81.0000 mg | DELAYED_RELEASE_TABLET | Freq: Every day | ORAL | Status: DC

## 2015-11-09 NOTE — Progress Notes (Signed)
Pt. Discharged to home  Discharge information reviewed and given All personal belongings have been taken by patient Education discussed and reviewed IV was d/c and intact upon removal Tele d/c and CCMD notified Delonna Ney 5:02 PM

## 2015-11-09 NOTE — Discharge Summary (Signed)
Discharge Summary    Patient ID: Bradley Castaneda,  MRN: [id number], DOB/AGE: 1971/07/11 45 y.o.  Admit date: 11/08/2015 Discharge date: 11/09/2015  Primary Care Provider: No PCP Per Patient Primary Cardiologist: Dr. Suzan Nailer  Discharge Diagnoses    Active Problems:   NSVT (nonsustained ventricular tachycardia) (HCC)   Chest pain   Apical variant hypertrophic cardiomyopathy (HCC)   AIVR (accelerated idioventricular rhythm)   Elevated troponin   Allergies No Known Allergies  Diagnostic Studies/Procedures    2D echo 11/08/15 -- Left ventricle: Apical hypertrophy. The cavity size was normal. Wall thickness was normal. Systolic function was normal. Theestimated ejection fraction was in the range of 55% to 60%. Doppler parameters are consistent with abnormal left ventricular relaxation (grade 1 diastolic dysfunction). 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 He had also self-recorded episodes of WCT.  Hospital Course     See H&P for further details - had felt fatigued for 2 days as if he was getting the flu, atypical fleeting shooting chest pain lasting seconds, and mild SOB. He connected himself to telemetry and saw 5 beats of a WCT. He then had a 3 beat run that he was able to capture on his phone. He reports his EKG has always shown LVH and T-wave abnormalities, and he apparently had an EKG/echo as a child and 6 years ago. Due to the arrhythmia he presented to the ER. Troponins were mildly elevated in a flat trend - 0.11-0.10-0.10-0.9-0.14. K was 3.6->3.9, Mg 1.9. He was admitted for further eval. Echo 11/08/15 showed apical hypertrophy, EF 55-60%, grade 1 DD. While on telemetry he had a 6 beat run of nonsustained VT, AIVR. Dr. Graciela Husbands recommended CTA to evaluate for PE as well as calcium scoring. CTA  was negative for PE, pericardial effusion or dissection. Findings were compatible with hypertrophic cardiomyopathy. Although calcium scoring is not available on the weekend, the study was reviewed by Dr. Royann Shivers who saw essentially no/low calcium by this study. The [facility] overread was benign as well. Dr. Graciela Husbands has recommended outpatient cardiac MR and stress testing to look for other risk factors as well as genetic testing. Please see his consult note for full discussion. The patient was started on aspirin and continued on home meds of atorvastatin and flonase. He was not started on BB due to bradycardia. I have sent a message to the EP scheduler to help arrange these things. Dr. Graciela Husbands and Dr. Eldridge Dace have seen and examined the patient today and feels he is stable for discharge.  _____________  Discharge Vitals Blood pressure 107/67, pulse 68, temperature 98.5 F (36.9 C), temperature source Oral, resp. rate 18, height  (1.753 m), weight 150 lb (68.04 kg), SpO2 100 %.  Filed Weights   11/08/15 1418  Weight: 150 lb (68.04 kg)    Labs & Radiologic Studies     CBC  Recent Labs  11/08/15 1415 11/08/15 1427  WBC 4.8  --   HGB 14.8 16.0  HCT 43.1 47.0  MCV 88.5  --   PLT 225  --    Basic Metabolic Panel  Recent Labs  11/08/15 1415  11/08/15 1909 11/09/15 0643  NA  --   < > 143 143  K  --   < > 3.7 3.9  CL  --   < >  106 107  CO2  --   --  27 28  GLUCOSE  --   < > 93 92  BUN  --   < > 9 9  CREATININE  --   < > 1.15 1.25*  CALCIUM  --   --  9.1 9.0  MG 1.9  --   --  1.8  PHOS 3.4  --   --   --   < > = values in this interval not displayed. Cardiac Enzymes  Recent Labs  11/09/15 0034 11/09/15 0643 11/09/15 1446  TROPONINI 0.10* 0.09* 0.14*   Fasting Lipid Panel  Recent Labs  11/09/15 0034  CHOL 147  HDL 47  LDLCALC 81  TRIG 94  CHOLHDL 3.1   Dg Chest 2 View  11/08/2015  CLINICAL DATA:  Shortness of breath and PVCs for 24 hours. EXAM: CHEST   2 VIEW COMPARISON:  06/24/2004 FINDINGS: Normal heart size and mediastinal contours. No acute infiltrate or edema. No effusion or pneumothorax. No acute osseous findings. IMPRESSION: Negative chest. Electronically Signed   By: Marnee Spring M.D.   On: 11/08/2015 14:47   Ct Angio Chest Pe W/cm &/or Wo Cm  11/09/2015  ADDENDUM REPORT: 11/09/2015 13:38 ADDENDUM: There is no convincing pulmonary embolism seen within the main, lobar or central segmental pulmonary arteries. Some of the most peripheral segmental and subsegmental pulmonary arteries are difficult to definitively characterize due to patient breathing motion artifact. Electronically Signed   By: Bary Richard M.D.   On: 11/09/2015 13:38  11/09/2015  CLINICAL DATA:  Hypertrophic cardiomyopathy, vague chest pain is EXAM: CT ANGIOGRAPHY CHEST WITH CONTRAST TECHNIQUE: Multidetector CT imaging of the chest was performed using the standard protocol during bolus administration of intravenous contrast. Multiplanar CT image reconstructions and MIPs were obtained to evaluate the vascular anatomy. CONTRAST:  OMNIPAQUE IOHEXOL 350 MG/ML SOLN COMPARISON:  None. FINDINGS: Characterization of the left ventricular wall is suboptimal due to lack of gating, but there does appear to be thickening of the left ventricular walls, interventricular wall measuring 1.8 cm thickness and left ventricular apex measuring up to 2.6 cm. Overall heart size is normal.  No pericardial effusion. Thoracic aorta is normal in caliber and configuration. No aortic aneurysm or dissection. No mass or enlarged lymph nodes within the mediastinum or perihilar regions. Lungs are clear. No pleural effusion. No pneumothorax. Trachea and central bronchi are unremarkable. Limited images of the upper abdomen are normal. No osseous abnormality seen. Superficial soft tissues are unremarkable. Review of the MIP images confirms the above findings. IMPRESSION: 1. Findings compatible with the given  history of hypertrophic cardiomyopathy, left ventricular wall measurements provided above, study limitations detailed above. Would consider more definitive characterization with echocardiogram or cardiac MRI. 2. Overall heart size is normal.  No pericardial effusion. 3. Lungs are clear. Electronically Signed: By: Bary Richard M.D. On: 11/09/2015 11:57   Ct Cardiac Scoring  11/09/2015  EXAM: OVER-READ INTERPRETATION  CT CHEST The following report is an over-read performed by radiologist Dr. Lavell Anchors [facility] on 11/09/2015. This over-read does not include interpretation of cardiac or coronary anatomy or pathology. The coronary calcium score/coronary CTA interpretation by the cardiologist is attached. COMPARISON:  None. FINDINGS: Heart size is normal. No pericardial effusion. Visualized portion of the thoracic aorta is normal in caliber and configuration. Visualized portions of the lungs are clear. Central bronchi are unremarkable. Visualized osseous structures are unremarkable. Limited images of the upper abdomen are unremarkable. IMPRESSION: No acute findings.  Electronically Signed   By: Bary Richard M.D.   On: 11/09/2015 12:37    Disposition   Pt is being discharged home today in good condition.  Follow-up Plans & Appointments    Follow-up Information    Follow up with Sherryl Manges, MD.   Specialty:  Cardiology   Why:  Office will call you for your followup appointment and additional studies. Call office if you have not heard back within 3 days.   Contact information:   [address] [place] [address] [phone]      Discharge Instructions    Diet - low sodium heart healthy    Complete by:  As directed      Increase activity slowly    Complete by:  As directed            Discharge Medications   Current Discharge Medication List    START taking these medications   Details  aspirin EC 81 MG EC tablet Take 1 tablet (81 mg total) by  mouth daily.      CONTINUE these medications which have NOT CHANGED   Details  atorvastatin (LIPITOR) 10 MG tablet Take 10 mg by mouth daily.    fluticasone (FLONASE) 50 MCG/ACT nasal spray Place 2 sprays into both nostrils daily as needed for allergies or rhinitis.         Outstanding Labs/Studies   N/A  Duration of Discharge Encounter   Greater than 30 minutes including physician time.  Signed, Tacey Ruiz Dunn PA-C 11/09/2015, 4:40 PM     I have examined the patient and reviewed assessment and plan and discussed with patient.  Agree with above as stated.  Discussed CT findings with the patient.  He has no PE and no significant coronary calcification.  Low HR prevents beta blocker use.  We discussed antiarrhythmics but at this point, such meds are not indicated.  He will have an OP cardiac MRI and OP stress test.  Office to call on Monday.     Benjie Ricketson S.

## 2015-11-09 NOTE — Consult Note (Signed)
ELECTROPHYSIOLOGY CONSULT NOTE  Patient ID: Bradley Castaneda, MRN: [id number], DOB/AGE: 05-27-1971 45 y.o. Admit date: 11/08/2015 Date of Consult: 11/09/2015  Primary Physician: No PCP Per Patient Primary Cardiologist: PR  Chief Complaint: VT and apical HCM     HPI Bradley Castaneda is a 45 y.o. male  Whom I'm asked to see after he presented yesterday with nonspecific symptoms and who had on his own behalf recorded AIVR and by echo was shown to have atypical HCM in the context of a family history  Thursday evening he began having this vague sensation of something not being quite right. There were vague chest pains. He was sometimes unaware of a difficulty taking a deep breath.He had this i to what you call up all night. This sensation was fairly constant permeating the next 24-36 hours although variablyevident. 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  He had played basketball on Tuesday for the first time in a long time. He had significant aches andrubbery in th in his legs. He also has some vague chest discomfort which was aggravated by palpation.  Yesterday because of the AIVR noted on his monitor he took himself to the emergency room where he was seen by Dr. Mardene Speak. He was admitted overnight. His troponins were elevated at 0.10 and have remained constant and elevated for the last 18 hours.  He denies a history of exertional chest discomfort.  He has had 2 episodes of syncope; they were both remote. Both of them occurredNext context of urination.  There is no family history of sudden death. While his father carries a diagnosis of IHSS, he died of sepsis/shock  To the best of his knowledge his siblings have not been screened for hypertrophic heart disease in many years. He himself was echoed decades ago     Past Medical History  Diagnosis Date  . Hypercholesterolemia   . NSVT (nonsustained  ventricular tachycardia) (HCC)   . Apical variant hypertrophic cardiomyopathy [facility])       Surgical History:  Past Surgical History  Procedure Laterality Date  . Knee surgery       Home Meds: Prior to Admission medications   Medication Sig Start Date End Date Taking? Authorizing Provider  atorvastatin (LIPITOR) 10 MG tablet Take 10 mg by mouth daily.   Yes Historical Provider, MD  fluticasone (FLONASE) 50 MCG/ACT nasal spray Place 2 sprays into both nostrils daily as needed for allergies or rhinitis.   Yes Historical Provider, MD    Inpatient Medications:  . aspirin  324 mg Oral NOW   Or  . aspirin  300 mg Rectal NOW  . aspirin EC  81 mg Oral Daily     Allergies: No Known Allergies  Social History   Social History  . Marital Status: Married    Spouse Name: N/A  . Number of Children: N/A  . Years of Education: N/A   Occupational History  . Not on file.   Social History Main Topics  . Smoking status: Never Smoker   . Smokeless tobacco: Never Used  . Alcohol Use: Yes     Comment: occasional  . Drug Use: No  . Sexual Activity: Not on file   Other Topics Concern  . Not on file   Social History Narrative     Family History  Problem Relation Age of Onset  . Heart disease Father     HCM-IHSS  .  Hypertension Mother   . Diabetes Father   . Hypertension Father   . Hypertension Sister   . CAD Mother      ROS:  Please see the history of present illness.     All other systems reviewed and negative.    Physical Exam:   Blood pressure 108/64, pulse 48, temperature 97.8 F (36.6 C), temperature source Oral, resp. rate 16, height  (1.753 m), weight 150 lb (68.04 kg), SpO2 100 %. General: Well developed, well nourished male in no acute distress. Head: Normocephalic, atraumatic, sclera non-icteric, no xanthomas, nares are without discharge. EENT: normal Lymph Nodes:  none Back: without scoliosis/kyphosis , no CVA tendersness Neck: Negative for carotid bruits.  JVD not elevated. Lungs: Clear bilaterally to auscultation without wheezes, rales, or rhonchi. Breathing is unlabored. Heart: RRR with S1 S2. No  murmur , rubs, or gallops appreciated. PMI prominent with double impulse Abdomen: Soft, non-tender, non-distended with normoactive bowel sounds. No hepatomegaly. No rebound/guarding. No obvious abdominal masses. Msk:  Strength and tone appear normal for age. Extremities: No clubbing or cyanosis. No edema.  Distal pedal pulses are 2+ and equal bilaterally. Skin: Warm and Dry Neuro: Alert and oriented X 3. CN III-XII intact Grossly normal sensory and motor function . Psych:  Responds to questions appropriately with a normal affect.      Labs: Cardiac Enzymes  Recent Labs  11/08/15 1624 11/08/15 1909 11/09/15 0034 11/09/15 0643  TROPONINI 0.11* 0.10* 0.10* 0.09*   CBC Lab Results  Component Value Date   WBC 4.8 11/08/2015   HGB 16.0 11/08/2015   HCT 47.0 11/08/2015   MCV 88.5 11/08/2015   PLT 225 11/08/2015   PROTIME: No results for input(s): LABPROT, INR in the last 72 hours. Chemistry   Recent Labs Lab 11/09/15 0643  NA 143  K 3.9  CL 107  CO2 28  BUN 9  CREATININE 1.25*  CALCIUM 9.0  GLUCOSE 92   Lipids Lab Results  Component Value Date   CHOL 147 11/09/2015   HDL 47 11/09/2015   LDLCALC 81 11/09/2015   TRIG 94 11/09/2015   BNP No results found for: PROBNP Thyroid Function Tests: No results for input(s): TSH, T4TOTAL, T3FREE, THYROIDAB in the last 72 hours.  Invalid input(s): FREET3    Miscellaneous No results found for: DDIMER  Radiology/Studies:  Dg Chest 2 View  11/08/2015  CLINICAL DATA:  Shortness of breath and PVCs for 24 hours. EXAM: CHEST  2 VIEW COMPARISON:  06/24/2004 FINDINGS: Normal heart size and mediastinal contours. No acute infiltrate or edema. No effusion or pneumothorax. No acute osseous findings. IMPRESSION: Negative chest. Electronically Signed   By: Marnee Spring M.D.   On:  11/08/2015 14:47    EKG: SR with ST depresion and minor T wave changes Tele VT NS 160 bpm AIVR   Assessment and Plan:  VTNS  APICAL HCM  DYSPNEA   Sensation of impending something   The patient has 2 parallel issues, The first is the cause of his current symptom complex iIn the context of a mildly persistent elevated troponin and the second is the awareness of apical HCM  with nonsustained ventricular tachycardia.  The symptom complex is Nonspecific. He has awareness of difficulty breathing and atypical chest pains. With his positive troponin, while unlikely we will exclude pulmonary embolism. We will do CT protocol. In addition, at the same time, will undertake a calcium score to exclude the presence of obstructive coronary disease which he should be able  to do with high sensitivity in this man  The issues of his HCM require further elucidation. Risk factors for sudden death include, in addition to his nonsustained ventricular tachycardia, wall thickness, syncope, family history of sudden death, and vasomotor depression with exercise. We also worry more significantly in patients  Who have gadolinium enhancement on MR imaging.  Furthermore, he has 3 childrenand 3 siblings. Genetic evaluation is appropriate especially given the diagnosis of his father. Gene testing would give [place] the most sensitive and specific tool for probingfor the family.  He is asked what his immediate risk of sudden death is. The presence of nonsustained VT is seen in 15-30% of patients with HCM. Thus, it is not a specific marker of risk. Overall studies is suggested that the risks are probably in the range of 1-2% at the upper limit hence, in the short-termI think the risks are very low. The patient expresses the concern that his wife understandably is quite anxious.If necessary LifeVest can be utilized.  His bradycardia precludes beta-blockade  For now, we will 1 undertake CT scanning to exclude pulmonary embolism  as well as coronary artery calcification 2 repeat the troponin to make sure the trend is flat and thus nondiagnostic 3  anticipate cardiac MR and outpatient stress testing to look for other risk factors 4-will anticipate genetic screening so as to be able to better characterize the[facility] 5 anticipate discharge this afternoon         Sherryl Manges

## 2015-11-09 NOTE — Progress Notes (Signed)
Pt asymptomatic and resting, pt denies pain or SOB, pt had 6 beat run of ventricular tachycardia, MD notified and ordered labs for a basic metabolic panel and magnesium for this morning, RN will continue to monitor, pt call bell within reach and bed in lowest position.     Mila Palmer, RN 11/09/2015

## 2015-11-11 ENCOUNTER — Other Ambulatory Visit (INDEPENDENT_AMBULATORY_CARE_PROVIDER_SITE_OTHER): Payer: 59 | Admitting: *Deleted

## 2015-11-11 ENCOUNTER — Other Ambulatory Visit: Payer: Self-pay | Admitting: *Deleted

## 2015-11-11 ENCOUNTER — Ambulatory Visit (INDEPENDENT_AMBULATORY_CARE_PROVIDER_SITE_OTHER): Payer: 59

## 2015-11-11 DIAGNOSIS — R002 Palpitations: Secondary | ICD-10-CM | POA: Diagnosis not present

## 2015-11-11 DIAGNOSIS — I4729 Other ventricular tachycardia: Secondary | ICD-10-CM

## 2015-11-11 DIAGNOSIS — R079 Chest pain, unspecified: Secondary | ICD-10-CM

## 2015-11-11 DIAGNOSIS — I422 Other hypertrophic cardiomyopathy: Secondary | ICD-10-CM

## 2015-11-11 DIAGNOSIS — I472 Ventricular tachycardia: Secondary | ICD-10-CM

## 2015-11-11 LAB — TROPONIN I: Troponin I: 0.06 ng/mL — ABNORMAL HIGH (ref <0.031)

## 2015-11-11 NOTE — Addendum Note (Signed)
Addended by: Tonita Phoenix on: 11/11/2015 02:31 PM   Modules accepted: Orders

## 2015-11-14 ENCOUNTER — Ambulatory Visit (HOSPITAL_COMMUNITY)
Admission: RE | Admit: 2015-11-14 | Discharge: 2015-11-14 | Disposition: A | Discharge status: Home / Self Care | Payer: 59 | Source: Ambulatory Visit | Attending: Internal Medicine | Admitting: Internal Medicine

## 2015-11-14 DIAGNOSIS — R002 Palpitations: Secondary | ICD-10-CM | POA: Insufficient documentation

## 2015-11-14 DIAGNOSIS — R079 Chest pain, unspecified: Secondary | ICD-10-CM | POA: Insufficient documentation

## 2015-11-14 DIAGNOSIS — I472 Ventricular tachycardia: Secondary | ICD-10-CM | POA: Insufficient documentation

## 2015-11-14 DIAGNOSIS — I422 Other hypertrophic cardiomyopathy: Secondary | ICD-10-CM | POA: Diagnosis not present

## 2015-11-14 DIAGNOSIS — I517 Cardiomegaly: Secondary | ICD-10-CM | POA: Insufficient documentation

## 2015-11-14 DIAGNOSIS — I4729 Other ventricular tachycardia: Secondary | ICD-10-CM

## 2015-11-14 MED ORDER — GADOBENATE DIMEGLUMINE 529 MG/ML IV SOLN
22.0000 mL | Freq: Once | INTRAVENOUS | Status: AC | PRN
  Administered 2015-11-14: 22 mL via INTRAVENOUS

## 2015-11-21 ENCOUNTER — Ambulatory Visit (INDEPENDENT_AMBULATORY_CARE_PROVIDER_SITE_OTHER): Payer: 59 | Admitting: Internal Medicine

## 2015-11-21 ENCOUNTER — Encounter (INDEPENDENT_AMBULATORY_CARE_PROVIDER_SITE_OTHER): Payer: 59

## 2015-11-21 DIAGNOSIS — I472 Ventricular tachycardia: Secondary | ICD-10-CM

## 2015-11-21 DIAGNOSIS — I422 Other hypertrophic cardiomyopathy: Secondary | ICD-10-CM | POA: Diagnosis not present

## 2015-11-21 DIAGNOSIS — I4729 Other ventricular tachycardia: Secondary | ICD-10-CM

## 2015-11-21 NOTE — Progress Notes (Signed)
Pt submitted for treadmill for risk stratification for HCM  Normal to hypertensive response Reviewed family issues with pt and his wife, re gene testing  They would like to porceed  They will continue to discuss ICD   We have reviewed SICD VS transvenous

## 2015-11-26 MED FILL — ATORVASTATIN 10 MG TABLET: 10 | 90 days supply | Qty: 90 | Fill #2

## 2015-11-30 LAB — EXERCISE TOLERANCE TEST
CHL CUP MPHR: 174 {beats}/min
Rest HR: 56 {beats}/min

## 2015-12-11 DIAGNOSIS — H5213 Myopia, bilateral: Secondary | ICD-10-CM | POA: Diagnosis not present

## 2015-12-12 ENCOUNTER — Telehealth: Payer: Self-pay | Admitting: Internal Medicine

## 2015-12-12 NOTE — Telephone Encounter (Signed)
New message  Please return call regarding this patient.

## 2016-01-30 DIAGNOSIS — I422 Other hypertrophic cardiomyopathy: Secondary | ICD-10-CM | POA: Diagnosis not present

## 2016-01-30 DIAGNOSIS — E785 Hyperlipidemia, unspecified: Secondary | ICD-10-CM | POA: Diagnosis not present

## 2016-02-27 ENCOUNTER — Telehealth: Payer: Self-pay | Admitting: Internal Medicine

## 2016-02-27 NOTE — Telephone Encounter (Signed)
New Message  Has to coordinate a blood draw HCM testing. Please call back to discuss

## 2016-02-27 NOTE — Telephone Encounter (Signed)
Lm to cb.

## 2016-03-02 MED FILL — ATORVASTATIN 10 MG TABLET: 10 | 90 days supply | Qty: 90 | Fill #0

## 2016-03-02 NOTE — Telephone Encounter (Signed)
Pt calling to arrange gene testing for HCM. Pt advised I will ask Herbert SetaHeather to follow up with him when she returns to the office tomorrow.  Pt gave me his cell phone number, [phone], and asked that she contact him tomorrow at that number.

## 2016-03-02 NOTE — Telephone Encounter (Signed)
LMTCB

## 2016-03-02 NOTE — Telephone Encounter (Signed)
Follow up ° ° ° ° ° °Returning a call to the nurse °

## 2016-03-03 NOTE — Telephone Encounter (Signed)
I left a message for the patient to call. 

## 2016-03-09 ENCOUNTER — Other Ambulatory Visit: Payer: 59

## 2016-03-09 NOTE — Telephone Encounter (Signed)
I spoke with the patient this morning. He did come and get his labs drawn this afternoon for genetic testing. He had to take the paperwork with him to fill out. He will bring this back tomorrow.

## 2016-03-09 NOTE — Telephone Encounter (Signed)
I spoke with the patient. He will try to come today ~4:30 pm to have his labs drawn.

## 2016-03-20 NOTE — Telephone Encounter (Signed)
Patient had labs drawn/ sent to [facility]- email confirmation received from Weldon InchesLisa Butler at [facility] that they did receive the patient's labs.

## 2016-03-23 DIAGNOSIS — I422 Other hypertrophic cardiomyopathy: Secondary | ICD-10-CM | POA: Diagnosis not present

## 2016-04-22 ENCOUNTER — Encounter: Payer: Self-pay | Admitting: Internal Medicine

## 2016-04-29 ENCOUNTER — Ambulatory Visit (INDEPENDENT_AMBULATORY_CARE_PROVIDER_SITE_OTHER): Payer: 59 | Admitting: Internal Medicine

## 2016-04-29 ENCOUNTER — Encounter: Payer: Self-pay | Admitting: Internal Medicine

## 2016-04-29 VITALS — BP 130/80 | HR 59 | Ht 69.0 in | Wt 155.6 lb

## 2016-04-29 DIAGNOSIS — I472 Ventricular tachycardia: Secondary | ICD-10-CM

## 2016-04-29 DIAGNOSIS — I422 Other hypertrophic cardiomyopathy: Secondary | ICD-10-CM

## 2016-04-29 DIAGNOSIS — I4729 Other ventricular tachycardia: Secondary | ICD-10-CM

## 2016-04-29 NOTE — Progress Notes (Signed)
      Patient Care Team: Bradley RainwaterIbethal Jaralla Shamleffer, MD as PCP - General (Internal Medicine)   HPI  Bradley Castaneda is a 45 y.o. male seen in follow-up for apical H CM without evidence of outflow tract obstruction.  He has a history of nonsustained ventricular tachycardia.  He had gadolinium enhancement diffusely in the septum and apex and the anterior and inferior walls but without evidence of apical ballooning.   Records and Results Reviewed Consultation notes from Dr. 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 Hypercholesterolemia   . NSVT (nonsustained ventricular tachycardia) (HCC)   . Apical variant hypertrophic cardiomyopathy (HCC)   . Elevated troponin     Past Surgical History  Procedure Laterality Date  . Knee surgery      Current Outpatient Prescriptions  Medication Sig Dispense Refill  . atorvastatin (LIPITOR) 10 MG tablet Take 10 mg by mouth daily.    . fluticasone (FLONASE) 50 MCG/ACT nasal spray Place 2 sprays into both nostrils daily as needed for allergies or rhinitis.    . naproxen sodium (RA NAPROXEN SODIUM) 220 MG tablet Take 2 tablets by mouth as needed. For pain     No current facility-administered medications for this visit.    No Known Allergies    Review of Systems negative except from HPI and PMH  Physical Exam BP 130/80 mmHg  Pulse 59  Ht 5\' 9"  (1.753 m)  Wt 155 lb 9.6 oz (70.58 kg)  BMI 22.97 kg/m2  SpO2 97% Well developed and well nourished in no acute distress HENT normal E scleral and icterus clear Neck Supple JVP flat; carotids brisk and full Clear to ausculation  Regular rate and rhythm, no murmurs gallops or rub Soft with active bowel sounds No clubbing cyanosis Edema Alert and oriented, grossly normal motor and sensory function Skin Warm and Dry  ECG sinus 59 14/09/41TWI 2,3,F, V4-6  Assessment and  Plan  Apical  HCM  VT-NS  + GAD   A lengthy discussion regarding the positive heme test implications her family screening. Dr. Jomarie Castaneda will be contacting the family tomorrow. I also discussed ICD implantation. Patient will like proceed with his ICD. We reviewed the benefits and risks and we will have the patient mapped.  More than 50% of 45 min was spent in counseling related to the above

## 2016-04-29 NOTE — Patient Instructions (Addendum)
Medication Instructions: - Your physician recommends that you continue on your current medications as directed. Please refer to the Current Medication list given to you today.  Labwork: - pending procedure date   Procedures/Testing: - Your physician has recommended that you have a defibrillator (S-ICD) inserted. An implantable cardioverter defibrillator (ICD) is a small device that is placed in your chest or, in rare cases, your abdomen. This device uses electrical pulses or shocks to help control life-threatening, irregular heartbeats that could lead the heart to suddenly stop beating (sudden cardiac arrest). Leads are attached to the ICD that goes into your heart. This is done in the hospital and usually requires an overnight stay.   - possible dates- Friday 8/11 or Thursday 8/17  Follow-Up: - pending procedure date  Any Additional Special Instructions Will Be Listed Below (If Applicable).  ** S-ICD mapping at 2:30 pm today**   If you need a refill on your cardiac medications before your next appointment, please call your pharmacy.

## 2016-05-12 ENCOUNTER — Telehealth: Payer: Self-pay | Admitting: Internal Medicine

## 2016-05-12 NOTE — Telephone Encounter (Signed)
Pt advised I will forward to Sherri to follow up with him about procedure date and details.

## 2016-05-12 NOTE — Telephone Encounter (Signed)
NEw Message  Pt stated- 8/11 is ok date for implant surgery- wanted to inform RN. Please advise.

## 2016-05-14 NOTE — Telephone Encounter (Signed)
Informed patient that he could not have SICD implant on 8/11 -- explained that day was already full for procedures.  (EP lab stated they could not add this case that day)  Patient understands Bradley Castaneda will follow up with him next week to decide on another possible date for this procedure.

## 2016-05-19 ENCOUNTER — Encounter: Payer: Self-pay | Admitting: Internal Medicine

## 2016-05-19 NOTE — Telephone Encounter (Signed)
Attempted to call the patient. I left a message for him to call back to schedule.

## 2016-05-21 NOTE — Telephone Encounter (Signed)
I spoke with the patient today. We will plan on scheduling his S-ICD implant for 9/27 with Dr. Graciela Husbands.

## 2016-05-27 MED FILL — ATORVASTATIN 10 MG TABLET: 10 | 90 days supply | Qty: 90 | Fill #1

## 2016-06-03 ENCOUNTER — Ambulatory Visit (INDEPENDENT_AMBULATORY_CARE_PROVIDER_SITE_OTHER): Payer: 59 | Admitting: Sports Medicine

## 2016-06-03 DIAGNOSIS — M25512 Pain in left shoulder: Secondary | ICD-10-CM

## 2016-06-03 NOTE — Progress Notes (Signed)
  Bradley DineDavid J Castaneda - 45 y.o. male MRN [id number]  Date of birth: 15-Mar-1971  SUBJECTIVE:  Including CC & ROS.  Chief Complaint  Patient presents with  . 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 The pain has been occurring since college. He was an all around gymnast for 14 years. He denies any specific dislocation or injury. He notices the pain the most when he is driving or swinging a golf club. He has PT performed last year and received an injection about 5 years ago. He has taken naproxen for the pain. He has gone to a chiropractor for his neck.   HISTORY: Past Medical, Surgical, Social, and Family History Reviewed & Updated per EMR.   Pertinent Historical Findings include: PMSHx -  HCM, scope of right knee  PSHx -  Currently a PICU attending  FHx -  HTN Medications - lipitor, naproxen   DATA REVIEWED: MRI left shoulder: 12/27/13: 1. Hypertrophy and tendinopathy of the subscapularis tendon.2. Slight increased signal in the rotator cuff interval which is nonspecific but can be seen with adhesive capsulitis. 3. Minimal osteophytes on the humeral head.  PHYSICAL EXAM:  VS: BP:120/70  HR: bpm  TEMP: ( )  RESP:   HT:5\' 9"  (175.3 cm)   WT:150 lb (68 kg)  BMI:22.2 PHYSICAL EXAM: Gen: NAD, alert, cooperative with exam, well-appearing HEENT: clear conjunctiva, EOMI CV:  no edema, capillary refill brisk,  Resp: non-labored, normal speech Skin: no rashes, normal turgor  Neuro: no gross deficits.  Psych:  alert and oriented Shoulder: Inspection reveals no abnormalities, atrophy or asymmetry. Palpation is normal with no tenderness over AC joint or bicipital groove. ROM is full in all planes. Rotator cuff strength normal throughout. Positive Hawkin's tests, empty can sign. No labral pathology noted with negative Obrien's, negative clunk and good stability. Normal scapular function observed. No painful arc and no drop arm sign. No apprehension  sign   Limited US: left shoulder: Normal appearing BT in long and short axis but with shallow groove. No subluxation noted in dynamic testing. Subscapularis viewed in long and short and normal in appearance. No impingement of subscapularis when dynamic testing. Small subacromial bursitis. Supraspinatus was normal in short and long axis. Slight impingement of the subacromial bursa when dynamically tested. Infraspinatus and TM were normal. AC joint with loose body on left with minimal arthritic changes.   ASSESSMENT & PLAN:   Shoulder pain Most likely his pain is [facility] with compress of the loose body that was observed.  - he will continue NSAIDS PRN  - he can get an AC injection if the pain becomes worse.

## 2016-06-04 DIAGNOSIS — M25519 Pain in unspecified shoulder: Secondary | ICD-10-CM | POA: Insufficient documentation

## 2016-06-04 NOTE — Assessment & Plan Note (Signed)
Most [facility] compress of the loose body that was observed.  - he will continue NSAIDS PRN  - he can get an AC injection if the pain becomes worse.

## 2016-06-12 MED FILL — AZITHROMYCIN 250 MG TABLET: 250 | 5 days supply | Qty: 6 | Fill #0

## 2016-06-17 ENCOUNTER — Telehealth: Payer: Self-pay | Admitting: Internal Medicine

## 2016-06-17 ENCOUNTER — Encounter: Payer: Self-pay | Admitting: *Deleted

## 2016-06-17 DIAGNOSIS — I472 Ventricular tachycardia: Secondary | ICD-10-CM

## 2016-06-17 DIAGNOSIS — I422 Other hypertrophic cardiomyopathy: Secondary | ICD-10-CM

## 2016-06-17 DIAGNOSIS — I4729 Other ventricular tachycardia: Secondary | ICD-10-CM

## 2016-06-17 DIAGNOSIS — Z01812 Encounter for preprocedural laboratory examination: Secondary | ICD-10-CM

## 2016-06-17 NOTE — Telephone Encounter (Signed)
I called and spoke with the patient. He is aware I will send his pre- procedure instruction through MyChart. He will come by the office to pick up his surgical scrub.  He will have his pre-procedure labs done at the hospital closer to his procedure date. Lab orders to be placed.

## 2016-06-17 NOTE — Telephone Encounter (Signed)
Follow Up:; ° ° °Returning your call. °

## 2016-06-18 ENCOUNTER — Other Ambulatory Visit: Payer: Self-pay | Admitting: Internal Medicine

## 2016-06-18 DIAGNOSIS — I421 Obstructive hypertrophic cardiomyopathy: Secondary | ICD-10-CM

## 2016-07-08 ENCOUNTER — Other Ambulatory Visit: Payer: 59 | Admitting: *Deleted

## 2016-07-08 DIAGNOSIS — I422 Other hypertrophic cardiomyopathy: Secondary | ICD-10-CM

## 2016-07-08 DIAGNOSIS — Z01812 Encounter for preprocedural laboratory examination: Secondary | ICD-10-CM

## 2016-07-08 DIAGNOSIS — I4729 Other ventricular tachycardia: Secondary | ICD-10-CM

## 2016-07-08 DIAGNOSIS — I472 Ventricular tachycardia: Secondary | ICD-10-CM | POA: Diagnosis not present

## 2016-07-08 LAB — CBC WITH DIFFERENTIAL/PLATELET
BASOS ABS: 0 {cells}/uL (ref 0–200)
Basophils Relative: 0 %
EOS ABS: 80 {cells}/uL (ref 15–500)
EOS PCT: 2 %
HCT: 41.2 % (ref 38.5–50.0)
Hemoglobin: 13.9 g/dL (ref 13.2–17.1)
Lymphocytes Relative: 61 %
Lymphs Abs: 2440 cells/uL (ref 850–3900)
MCH: 29.4 pg (ref 27.0–33.0)
MCHC: 33.7 g/dL (ref 32.0–36.0)
MCV: 87.3 fL (ref 80.0–100.0)
MONOS PCT: 9 %
MPV: 11.1 fL (ref 7.5–12.5)
Monocytes Absolute: 360 cells/uL (ref 200–950)
NEUTROS PCT: 28 %
Neutro Abs: 1120 cells/uL — ABNORMAL LOW (ref 1500–7800)
PLATELETS: 241 10*3/uL (ref 140–400)
RBC: 4.72 MIL/uL (ref 4.20–5.80)
RDW: 13.5 % (ref 11.0–15.0)
WBC: 4 10*3/uL (ref 3.8–10.8)

## 2016-07-09 LAB — BASIC METABOLIC PANEL
BUN: 11 mg/dL (ref 7–25)
CALCIUM: 8.9 mg/dL (ref 8.6–10.3)
CHLORIDE: 104 mmol/L (ref 98–110)
CO2: 25 mmol/L (ref 20–31)
CREATININE: 1.09 mg/dL (ref 0.60–1.35)
GLUCOSE: 104 mg/dL — AB (ref 65–99)
Potassium: 4.1 mmol/L (ref 3.5–5.3)
Sodium: 140 mmol/L (ref 135–146)

## 2016-07-09 LAB — PROTIME-INR
INR: 1.1
PROTHROMBIN TIME: 11.2 s (ref 9.0–11.5)

## 2016-07-15 ENCOUNTER — Encounter (HOSPITAL_COMMUNITY)
Admission: RE | Disposition: A | Discharge status: Home / Self Care | Payer: Self-pay | Source: Ambulatory Visit | Attending: Internal Medicine

## 2016-07-15 ENCOUNTER — Encounter (HOSPITAL_COMMUNITY): Payer: Self-pay | Admitting: *Deleted

## 2016-07-15 ENCOUNTER — Ambulatory Visit (HOSPITAL_COMMUNITY): Payer: 59

## 2016-07-15 ENCOUNTER — Ambulatory Visit (HOSPITAL_COMMUNITY)
Admission: RE | Admit: 2016-07-15 | Discharge: 2016-07-15 | Disposition: A | Discharge status: Home / Self Care | Payer: 59 | Source: Ambulatory Visit | Attending: Internal Medicine | Admitting: Internal Medicine

## 2016-07-15 ENCOUNTER — Ambulatory Visit (HOSPITAL_COMMUNITY): Payer: 59 | Admitting: Certified Registered"

## 2016-07-15 ENCOUNTER — Encounter: Payer: Self-pay | Admitting: Internal Medicine

## 2016-07-15 DIAGNOSIS — E7800 Pure hypercholesterolemia, unspecified: Secondary | ICD-10-CM | POA: Insufficient documentation

## 2016-07-15 DIAGNOSIS — Z959 Presence of cardiac and vascular implant and graft, unspecified: Secondary | ICD-10-CM

## 2016-07-15 DIAGNOSIS — Z833 Family history of diabetes mellitus: Secondary | ICD-10-CM | POA: Insufficient documentation

## 2016-07-15 DIAGNOSIS — Z950 Presence of cardiac pacemaker: Secondary | ICD-10-CM | POA: Diagnosis not present

## 2016-07-15 DIAGNOSIS — Z8249 Family history of ischemic heart disease and other diseases of the circulatory system: Secondary | ICD-10-CM | POA: Insufficient documentation

## 2016-07-15 DIAGNOSIS — I5030 Unspecified diastolic (congestive) heart failure: Secondary | ICD-10-CM | POA: Insufficient documentation

## 2016-07-15 DIAGNOSIS — Z7951 Long term (current) use of inhaled steroids: Secondary | ICD-10-CM | POA: Insufficient documentation

## 2016-07-15 DIAGNOSIS — I472 Ventricular tachycardia: Secondary | ICD-10-CM | POA: Insufficient documentation

## 2016-07-15 DIAGNOSIS — I421 Obstructive hypertrophic cardiomyopathy: Secondary | ICD-10-CM | POA: Diagnosis not present

## 2016-07-15 DIAGNOSIS — I422 Other hypertrophic cardiomyopathy: Secondary | ICD-10-CM | POA: Diagnosis present

## 2016-07-15 HISTORY — PX: EP IMPLANTABLE DEVICE: SHX172B

## 2016-07-15 LAB — SURGICAL PCR SCREEN
MRSA, PCR: NEGATIVE
Staphylococcus aureus: NEGATIVE

## 2016-07-15 SURGERY — SUBQ ICD IMPLANT
Anesthesia: General

## 2016-07-15 MED ORDER — SODIUM CHLORIDE 0.9 % IR SOLN
Status: AC
  Filled 2016-07-15: qty 2

## 2016-07-15 MED ORDER — LACTATED RINGERS IV SOLN
INTRAVENOUS | Status: DC | PRN
  Administered 2016-07-15: 09:00:00 via INTRAVENOUS

## 2016-07-15 MED ORDER — ONDANSETRON HCL 4 MG/2ML IJ SOLN
INTRAMUSCULAR | Status: DC | PRN
  Administered 2016-07-15: 4 mg via INTRAVENOUS

## 2016-07-15 MED ORDER — SODIUM CHLORIDE 0.9 % IV SOLN
INTRAVENOUS | Status: DC
  Administered 2016-07-15: 07:00:00 via INTRAVENOUS

## 2016-07-15 MED ORDER — DEXAMETHASONE SODIUM PHOSPHATE 10 MG/ML IJ SOLN
INTRAMUSCULAR | Status: DC | PRN
  Administered 2016-07-15: 10 mg via INTRAVENOUS

## 2016-07-15 MED ORDER — ONDANSETRON HCL 4 MG/2ML IJ SOLN: 4.0000 mg | Freq: Four times a day (QID) | INTRAMUSCULAR | Status: DC | PRN

## 2016-07-15 MED ORDER — HEPARIN (PORCINE) IN NACL 2-0.9 UNIT/ML-% IJ SOLN
INTRAMUSCULAR | Status: AC
  Filled 2016-07-15: qty 500

## 2016-07-15 MED ORDER — OXYCODONE-ACETAMINOPHEN 5-325 MG PO TABS
1.0000 | ORAL_TABLET | Freq: Four times a day (QID) | ORAL | Status: DC | PRN
  Administered 2016-07-15: 1 via ORAL
  Filled 2016-07-15: qty 1

## 2016-07-15 MED ORDER — ACETAMINOPHEN 325 MG PO TABS
325.0000 mg | ORAL_TABLET | ORAL | Status: DC | PRN
  Administered 2016-07-15: 650 mg via ORAL
  Filled 2016-07-15: qty 2

## 2016-07-15 MED ORDER — LIDOCAINE HCL (CARDIAC) 20 MG/ML IV SOLN
INTRAVENOUS | Status: DC | PRN
  Administered 2016-07-15: 100 mg via INTRATRACHEAL

## 2016-07-15 MED ORDER — SODIUM CHLORIDE 0.9 % IV SOLN: INTRAVENOUS | Status: AC

## 2016-07-15 MED ORDER — MUPIROCIN 2 % EX OINT
1.0000 "application " | TOPICAL_OINTMENT | Freq: Once | CUTANEOUS | Status: DC
  Filled 2016-07-15: qty 22

## 2016-07-15 MED ORDER — DIPHENHYDRAMINE HCL 25 MG PO TABS
25.0000 mg | ORAL_TABLET | Freq: Every evening | ORAL | Status: DC | PRN
  Filled 2016-07-15: qty 2

## 2016-07-15 MED ORDER — OXYCODONE-ACETAMINOPHEN 5-325 MG PO TABS: 1.0000 | ORAL_TABLET | Freq: Four times a day (QID) | ORAL | 0 refills | Status: DC | PRN

## 2016-07-15 MED ORDER — GLYCOPYRROLATE 0.2 MG/ML IJ SOLN
INTRAMUSCULAR | Status: DC | PRN
Start: 2016-07-15 — End: 2016-07-15
  Administered 2016-07-15: 0.1 mg via INTRAVENOUS

## 2016-07-15 MED ORDER — PROPOFOL 10 MG/ML IV BOLUS
INTRAVENOUS | Status: DC | PRN
  Administered 2016-07-15: 70 mg via INTRAVENOUS
  Administered 2016-07-15: 200 mg via INTRAVENOUS

## 2016-07-15 MED ORDER — CEFAZOLIN SODIUM-DEXTROSE 2-4 GM/100ML-% IV SOLN
2.0000 g | INTRAVENOUS | Status: AC
  Administered 2016-07-15: 2 g via INTRAVENOUS
  Filled 2016-07-15: qty 100

## 2016-07-15 MED ORDER — FENTANYL CITRATE (PF) 100 MCG/2ML IJ SOLN
INTRAMUSCULAR | Status: DC | PRN
  Administered 2016-07-15 (×4): 50 ug via INTRAVENOUS

## 2016-07-15 MED ORDER — ATORVASTATIN CALCIUM 10 MG PO TABS
10.0000 mg | ORAL_TABLET | Freq: Every day | ORAL | Status: DC
  Filled 2016-07-15: qty 1

## 2016-07-15 MED ORDER — HEPARIN (PORCINE) IN NACL 2-0.9 UNIT/ML-% IJ SOLN
INTRAMUSCULAR | Status: DC | PRN
  Administered 2016-07-15: 10:00:00

## 2016-07-15 MED ORDER — CEFAZOLIN IN D5W 1 GM/50ML IV SOLN
1.0000 g | Freq: Four times a day (QID) | INTRAVENOUS | Status: DC
  Administered 2016-07-15: 1 g via INTRAVENOUS
  Filled 2016-07-15 (×3): qty 50

## 2016-07-15 MED ORDER — SODIUM CHLORIDE 0.9 % IR SOLN
80.0000 mg | Status: AC
  Administered 2016-07-15: 80 mg
  Filled 2016-07-15: qty 2

## 2016-07-15 MED ORDER — MUPIROCIN 2 % EX OINT
TOPICAL_OINTMENT | CUTANEOUS | Status: AC
  Administered 2016-07-15: 1
  Filled 2016-07-15: qty 22

## 2016-07-15 MED ORDER — FENTANYL CITRATE (PF) 100 MCG/2ML IJ SOLN: 25.0000 ug | INTRAMUSCULAR | Status: DC | PRN

## 2016-07-15 MED ORDER — SODIUM CHLORIDE 0.9 % IV SOLN
INTRAVENOUS | Status: DC
  Administered 2016-07-15 (×2): via INTRAVENOUS

## 2016-07-15 MED ORDER — CEFAZOLIN SODIUM-DEXTROSE 2-4 GM/100ML-% IV SOLN
INTRAVENOUS | Status: AC
  Filled 2016-07-15: qty 100

## 2016-07-15 MED ORDER — PHENYLEPHRINE HCL 10 MG/ML IJ SOLN
INTRAMUSCULAR | Status: DC | PRN
  Administered 2016-07-15 (×2): 80 ug via INTRAVENOUS
  Administered 2016-07-15: 120 ug via INTRAVENOUS

## 2016-07-15 MED ORDER — PROMETHAZINE HCL 25 MG/ML IJ SOLN: 6.2500 mg | INTRAMUSCULAR | Status: DC | PRN

## 2016-07-15 MED ORDER — FLUTICASONE PROPIONATE 50 MCG/ACT NA SUSP
2.0000 | Freq: Every day | NASAL | Status: DC | PRN
  Filled 2016-07-15: qty 16

## 2016-07-15 MED ORDER — LIDOCAINE HCL (PF) 1 % IJ SOLN
INTRAMUSCULAR | Status: DC | PRN
  Administered 2016-07-15: 50 mL

## 2016-07-15 MED ORDER — LIDOCAINE HCL (PF) 1 % IJ SOLN
INTRAMUSCULAR | Status: AC
Start: 2016-07-15 — End: 2016-07-15
  Filled 2016-07-15: qty 60

## 2016-07-15 MED FILL — OXYCODONE/APAP 5-325: 5-325 | 2 days supply | Qty: 5 | Fill #0

## 2016-07-15 SURGICAL SUPPLY — 6 items
BLANKET WARM UNDERBOD FULL ACC (MISCELLANEOUS) ×1 IMPLANT
HEMOSTAT SURGICEL 2X4 FIBR (HEMOSTASIS) ×1 IMPLANT
ICD SUBCU MRI EMBLEM A219 (ICD Generator) ×1 IMPLANT
ICD SUBQ EMBLEM S-ICD 3401 (Lead) ×1 IMPLANT
PAD DEFIB LIFELINK (PAD) ×2 IMPLANT
TRAY PACEMAKER INSERTION (PACKS) ×1 IMPLANT

## 2016-07-15 NOTE — Interval H&P Note (Signed)
ICD Criteria  Current LVEF:55%. Within 12 months prior to implant: Yes   Heart failure history: No  Cardiomyopathy history: Yes, Non-Ischemic Cardiomyopathy.  Atrial Fibrillation/Atrial Flutter: No.  Ventricular tachycardia history: Yes, No hemodynamic instability. VT Type: Non-Sustained Ventricular Tachycardia.  Cardiac arrest history: No.  History of syndromes with risk of sudden death: Yes, Other  Hypertrophic Cardiomyopathy   Previous ICD: No.  Current ICD indication: Primary  PPM indication: No.   Class I or II Bradycardia indication present: No  Beta Blocker therapy for 3 or more months: No, medical reason.  Ace Inhibitor/ARB therapy for 3 or more months: No, medical reason.  History and Physical Interval Note:  07/15/2016 8:01 AM  Bradley Castaneda  has presented today for surgery, with the diagnosis of hypertrophic cardiomyopathy  The various methods of treatment have been discussed with the patient and family. After consideration of risks, benefits and other options for treatment, the patient has consented to  Procedure(s): SubQ ICD Implant (N/A) as a surgical intervention .  The patient's history has been reviewed, patient examined, no change in status, stable for surgery.  I have reviewed the patient's chart and labs.  Questions were answered to the patient's satisfaction.     Sherryl MangesSteven Klein

## 2016-07-15 NOTE — H&P (Signed)
Patient Care Team: [name], MD as PCP - General (Internal Medicine)   HPI  Bradley DineDavid J Castaneda is a 45 y.o. male Admitted for ICD implantation for primary prevention in setting of apical HCM Gene + (MYH7) without evidence of outflow tract obstruction.  He has a history of nonsustained ventricular tachycardia.  He had gadolinium enhancement diffusely in the septum and apex and the anterior and inferior walls but without evidence of apical ballooning.  [facility] who confirmed recommendtaion      Past Medical History:  Diagnosis Date  . Apical variant hypertrophic cardiomyopathy (HCC)   . Elevated troponin   . Hypercholesterolemia   . NSVT (nonsustained ventricular tachycardia) (HCC)     Past Surgical History:  Procedure Laterality Date  . KNEE SURGERY      Current Facility-Administered Medications  Medication Dose Route Frequency Provider Last Rate Last Dose  . 0.9 %  sodium chloride infusion   Intravenous Continuous Duke SalviaSteven C Rylie Knierim, MD 50 mL/hr at 07/15/16 [phone]    . 0.9 %  sodium chloride infusion   Intravenous Continuous Duke SalviaSteven C Ari Engelbrecht, MD 50 mL/hr at 07/15/16 [phone]    . ceFAZolin (ANCEF) IVPB 2g/100 mL premix  2 g Intravenous On Call Duke SalviaSteven C Ryonna Cimini, MD      . gentamicin (GARAMYCIN) 80 mg in sodium chloride irrigation 0.9 % 500 mL irrigation  80 mg Irrigation On Call Duke SalviaSteven C Quanta Roher, MD      . mupirocin ointment (BACTROBAN) 2 % 1 application  1 application Topical Once Duke SalviaSteven C Anyssa Sharpless, MD        No Known Allergies  Social History  Substance Use Topics  . Smoking status: Never Smoker  . Smokeless tobacco: Never Used  . Alcohol use Yes     Comment: occasional   Family History  Problem Relation Age of Onset  . Heart disease Father     HCM-IHSS  . Diabetes Father   . Hypertension Father   . Hypertension Mother   . CAD Mother   . Hypertension Sister    No current facility-administered medications on file prior to encounter.    Current  Outpatient Prescriptions on File Prior to Encounter  Medication Sig Dispense Refill  . atorvastatin (LIPITOR) 10 MG tablet Take 10 mg by mouth daily.    . fluticasone (FLONASE) 50 MCG/ACT nasal spray Place 2 sprays into both nostrils daily as needed for allergies or rhinitis.    . naproxen sodium (RA NAPROXEN SODIUM) 220 MG tablet Take 2 tablets by mouth as needed. For pain       Review of Systems negative except from HPI and PMH  Physical Exam BP 124/86   Pulse (!) 54   Temp 97.7 F (36.5 C) (Oral)   Resp 18   Ht 5' 9.5" (1.765 m)   Wt 150 lb (68 kg)   SpO2 100%   BMI 21.83 kg/m  Well developed and well nourished in no acute distress HENT normal E scleral and icterus clear Neck Supple JVP flat; carotids brisk and full Clear to ausculation  *Regular rate and rhythm, no murmurs gallops or rub Soft with active bowel sounds No clubbing cyanosis  Edema Alert and oriented, grossly normal motor and sensory function Skin Warm and Dry    Assessment and  Plan  HCM -apical variant  + DGE  Gene + (MHY7)   VT NS   For SICD today   Reviewed with pt and family

## 2016-07-15 NOTE — Anesthesia Procedure Notes (Signed)
Procedure Name: LMA Insertion Date/Time: 07/15/2016 8:57 AM Performed by: Rosiland OzMEYERS, Mayda Shippee Pre-anesthesia Checklist: Patient identified, Emergency Drugs available, Suction available, Patient being monitored and Timeout performed Patient Re-evaluated:Patient Re-evaluated prior to inductionOxygen Delivery Method: Circle system utilized Preoxygenation: Pre-oxygenation with 100% oxygen Intubation Type: IV induction Ventilation: Mask ventilation without difficulty LMA: LMA inserted LMA Size: 5.0 Number of attempts: 2 Placement Confirmation: positive ETCO2 and breath sounds checked- equal and bilateral Dental Injury: Teeth and Oropharynx as per pre-operative assessment

## 2016-07-15 NOTE — Anesthesia Preprocedure Evaluation (Addendum)
Anesthesia Evaluation  Patient identified by MRN, date of birth, ID band Patient awake    Reviewed: Allergy & Precautions, NPO status , Patient's Chart, lab work & pertinent test results  Airway Mallampati: II       Dental no notable dental hx.    Pulmonary neg pulmonary ROS,    Pulmonary exam normal        Cardiovascular +CHF (apical hypertrophic non-obstructive cardiomyopathy)  Normal cardiovascular exam+ dysrhythmias Ventricular Tachycardia   Left ventricle: Apical hypertrophy. The cavity size was normal.   Wall thickness was normal. Systolic function was normal. The   estimated ejection fraction was in the range of 55% to 60%.   Doppler parameters are consistent with abnormal left ventricular   relaxation (grade 1 diastolic dysfunction).   Neuro/Psych negative neurological ROS     GI/Hepatic negative GI ROS, Neg liver ROS,   Endo/Other  negative endocrine ROS  Renal/GU negative Renal ROS     Musculoskeletal negative musculoskeletal ROS (+)   Abdominal   Peds  Hematology negative hematology ROS (+)   Anesthesia Other Findings Day of surgery medications reviewed with the patient.  Reproductive/Obstetrics                            Anesthesia Physical Anesthesia Plan  ASA: III  Anesthesia Plan: General   Post-op Pain Management:    Induction: Intravenous  Airway Management Planned: LMA  Additional Equipment:   Intra-op Plan:   Post-operative Plan: Extubation in OR  Informed Consent: I have reviewed the patients History and Physical, chart, labs and discussed the procedure including the risks, benefits and alternatives for the proposed anesthesia with the patient or authorized representative who has indicated his/her understanding and acceptance.   Dental advisory given  Plan Discussed with: CRNA  Anesthesia Plan Comments:        Anesthesia Quick Evaluation

## 2016-07-15 NOTE — Transfer of Care (Signed)
Immediate Anesthesia Transfer of Care Note  Patient: Tito DineDavid J Dombrosky  Procedure(s) Performed: Procedure(s): SubQ ICD Implant (N/A)  Patient Location: Cath Lab  Anesthesia Type:General  Level of Consciousness: awake, alert , oriented and patient cooperative  Airway & Oxygen Therapy: Patient Spontanous Breathing  Post-op Assessment: Report given to RN and Post -op Vital signs reviewed and stable  Post vital signs: Reviewed and stable  Last Vitals:  Vitals:   07/15/16 1205 07/15/16 1223  BP: 122/83 119/84  Pulse: (!) 51 (!) 56  Resp: 14 16  Temp:  36.4 C    Last Pain:  Vitals:   07/15/16 1223  TempSrc: Oral         Complications: No apparent anesthesia complications

## 2016-07-15 NOTE — Progress Notes (Signed)
The patient has been seen/examined by Dr. Graciela HusbandsKlein, found stable to be discharged to home Usual post ICD implant f/u has been arranged Percocet 5/235mg  tab #5 tablets will be prescribed for sub Q ICD implant pain I have [facility] the [facility], the patient has not been previously prescribed narcotics in the last 12 months. VSS Wound stable Wound care and activity restrictions have been discussed with the patient by Dr. Graciela HusbandsKlein.  Francis Dowseenee Amandy Chubbuck, PA-C

## 2016-07-15 NOTE — Progress Notes (Signed)
Discharge instructions and prescriptions given to patient. Patient and wife educated, no questions at this time. Pt site is clean, dry and intact. Pt refused flu shot and stated he will get it at a later time. Pt being discharged home with wife.

## 2016-07-15 NOTE — Anesthesia Postprocedure Evaluation (Signed)
Anesthesia Post Note  Patient: Bradley Castaneda  Procedure(s) Performed: Procedure(s) (LRB): SubQ ICD Implant (N/A)  Patient location during evaluation: PACU Anesthesia Type: General Level of consciousness: awake and alert Pain management: pain level controlled Vital Signs Assessment: post-procedure vital signs reviewed and stable Respiratory status: spontaneous breathing, nonlabored ventilation, respiratory function stable and patient connected to nasal cannula oxygen Cardiovascular status: blood pressure returned to baseline and stable Postop Assessment: no signs of nausea or vomiting Anesthetic complications: no    Last Vitals:  Vitals:   07/15/16 1205 07/15/16 1223  BP: 122/83 119/84  Pulse: (!) 51 (!) 56  Resp: 14 16  Temp:  36.4 C    Last Pain:  Vitals:   07/15/16 1223  TempSrc: Oral                 Bradley Castaneda

## 2016-07-15 NOTE — Discharge Instructions (Signed)
° ° °  Supplemental Discharge Instructions for  Pacemaker/Defibrillator Patients  Activity No heavy lifting or vigorous activity 6 to 8 weeks.           NO DRIVING for  1 week    ; you may begin driving on  40/06/8109/4/17  .  WOUND CARE - Keep the wound area clean and dry.  Do not get this area wet for 24 hours. No showers for 24 hours; you may shower on 07/16/16 evening    . - The tape/steri-strips on your wound will fall off; do not pull them off.  No bandage is needed on the site.  DO  NOT apply any creams, oils, or ointments to the wound area. - If you notice any drainage or discharge from the wound, any swelling or bruising at the site, or you develop a fever > 101? F after you are discharged home, call the office at once.  Special Instructions - You are still able to use cellular telephones; use the ear opposite the side where you have your pacemaker/defibrillator.  Avoid carrying your cellular phone near your device. - When traveling through airports, show security personnel your identification card to avoid being screened in the metal detectors.  Ask the security personnel to use the hand wand. - Avoid arc welding equipment, MRI testing (magnetic resonance imaging), TENS units (transcutaneous nerve stimulators).  Call the office for questions about other devices. - Avoid electrical appliances that are in poor condition or are not properly grounded. - Microwave ovens are safe to be near or to operate.  Additional information for defibrillator patients should your device go off: - If your device goes off ONCE and you feel fine afterward, notify the device clinic nurses. - If your device goes off ONCE and you do not feel well afterward, call 911. - If your device goes off TWICE, call 911. - If your device goes off THREE times in one day, call 911.  DO NOT DRIVE YOURSELF OR A FAMILY MEMBER WITH A DEFIBRILLATOR TO THE HOSPITAL--CALL 911.

## 2016-07-27 ENCOUNTER — Ambulatory Visit (INDEPENDENT_AMBULATORY_CARE_PROVIDER_SITE_OTHER): Payer: 59 | Admitting: *Deleted

## 2016-07-27 ENCOUNTER — Telehealth: Payer: Self-pay | Admitting: Internal Medicine

## 2016-07-27 DIAGNOSIS — I424 Endocardial fibroelastosis: Secondary | ICD-10-CM | POA: Diagnosis not present

## 2016-07-27 DIAGNOSIS — I422 Other hypertrophic cardiomyopathy: Secondary | ICD-10-CM

## 2016-07-27 NOTE — Progress Notes (Signed)
Wound Subcutaneous ICD check in clinic. Dermabond removed, incision healed, no redness, swelling or drainage. Lower lateral edge of incision superficially un-approximated, 2 steri-strips applied.  0 untreated episodes; 0 treated episodes; 0 shocks delivered. Electrode impedance status okay. No programming changes. Remaining longevity to ERI 100%. ROV w/ device clinic 08/04/2016 for wound check. ROV with SK 10/27/2016.

## 2016-07-27 NOTE — Telephone Encounter (Signed)
I was about to call patient, but patient is currently being seen in office.

## 2016-07-27 NOTE — Telephone Encounter (Signed)
New Message:      Please call,concerning his appt today.

## 2016-08-03 ENCOUNTER — Ambulatory Visit (INDEPENDENT_AMBULATORY_CARE_PROVIDER_SITE_OTHER): Payer: 59 | Admitting: *Deleted

## 2016-08-03 DIAGNOSIS — Z95810 Presence of automatic (implantable) cardiac defibrillator: Secondary | ICD-10-CM

## 2016-08-03 NOTE — Patient Instructions (Signed)
Wound re-check in clinic. Steri strip removed, incision edges well approximated. ROV with SK 10/27/2016

## 2016-09-15 MED FILL — ATORVASTATIN 10 MG TABLET: 10 | 90 days supply | Qty: 90 | Fill #2

## 2016-10-27 ENCOUNTER — Ambulatory Visit (INDEPENDENT_AMBULATORY_CARE_PROVIDER_SITE_OTHER): Payer: 59 | Admitting: Internal Medicine

## 2016-10-27 ENCOUNTER — Encounter: Payer: Self-pay | Admitting: Internal Medicine

## 2016-10-27 VITALS — BP 116/80 | HR 65 | Ht 68.0 in | Wt 156.2 lb

## 2016-10-27 DIAGNOSIS — Z95810 Presence of automatic (implantable) cardiac defibrillator: Secondary | ICD-10-CM

## 2016-10-27 DIAGNOSIS — I472 Ventricular tachycardia: Secondary | ICD-10-CM | POA: Diagnosis not present

## 2016-10-27 DIAGNOSIS — I422 Other hypertrophic cardiomyopathy: Secondary | ICD-10-CM | POA: Diagnosis not present

## 2016-10-27 DIAGNOSIS — I4729 Other ventricular tachycardia: Secondary | ICD-10-CM

## 2016-10-27 LAB — CUP PACEART INCLINIC DEVICE CHECK
Implantable Lead Location: 753858
Implantable Pulse Generator Implant Date: 20170927
MDC IDC LEAD IMPLANT DT: 20170927
MDC IDC SESS DTM: 20180109174707
Pulse Gen Serial Number: 213564

## 2016-10-27 NOTE — Patient Instructions (Signed)
Medication Instructions:    Your physician recommends that you continue on your current medications as directed. Please refer to the Current Medication list given to you today.  --- If you need a refill on your cardiac medications before your next appointment, please call your pharmacy. ---  Labwork:  None ordered  Testing/Procedures:  None ordered  Follow-Up: Remote monitoring is used to monitor your Pacemaker of ICD from home. This monitoring reduces the number of office visits required to check your device to one time per year. It allows us to keep an eye on the functioning of your device to ensure it is working properly. You are scheduled for a device check from home on 01/26/2017. You may send your transmission at any time that day. If you have a wireless device, the transmission will be sent automatically. After your physician reviews your transmission, you will receive a postcard with your next transmission date.   Your physician wants you to follow-up in: 9 months with Dr. Graciela HusbandsKlein.  You will receive a reminder letter in the mail two months in advance. If you don't receive a letter, please call our office to schedule the follow-up appointment.  Thank you for choosing [facility]!!   Dory HornSherri Ladale Sherburn, RN [phone]

## 2016-10-27 NOTE — Progress Notes (Signed)
      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  He has a history of nonsustained ventricular tachycardia.  He had gadolinium enhancement diffusely in the septum and apex and the anterior and inferior walls but without evidence of apical ballooning. He is status post S ICD implantation 9/17  Records and Results Reviewed Consultation notes from Dr. Regino SchultzeWang denies any symptoms 6/17 and genetic reports demonstrated MYH7  Gene abnormality    Past Medical History:  Diagnosis Date  . Apical variant hypertrophic cardiomyopathy (HCC)   . Elevated troponin   . Hypercholesterolemia   . NSVT (nonsustained ventricular tachycardia) (HCC)     Past Surgical History:  Procedure Laterality Date  . EP IMPLANTABLE DEVICE N/A 07/15/2016   Procedure: SubQ ICD Implant;  Surgeon: Duke SalviaSteven C Klein, MD;  [facility];  Service: Cardiovascular;  Laterality: N/A;  . KNEE SURGERY      Current Outpatient Prescriptions  Medication Sig Dispense Refill  . atorvastatin (LIPITOR) 10 MG tablet Take 10 mg by mouth daily.    . diphenhydrAMINE (BENADRYL) 25 MG tablet Take 25-50 mg by mouth at bedtime as needed for allergies or sleep.    . fluticasone (FLONASE) 50 MCG/ACT nasal spray Place 2 sprays into both nostrils daily as needed for allergies or rhinitis.    . naproxen sodium (RA NAPROXEN SODIUM) 220 MG tablet Take 2 tablets by mouth as needed. For pain     No current facility-administered medications for this visit.     No Known Allergies    Review of Systems negative except from HPI and PMH  Physical Exam BP 116/80   Pulse 65   Ht 5\' 8"  (1.727 m)   Wt 156 lb 3.2 oz (70.9 kg)   SpO2 97%   BMI 23.75 kg/m  Well developed and well nourished in no acute distress HENT normal E scleral and icterus clear Neck  Supple JVP flat; carotids brisk and full Clear to ausculation  Regular rate and rhythm, no murmurs gallops or rub Soft with active bowel sounds No clubbing cyanosis Edema Alert and oriented, grossly normal motor and sensory function Skin Warm and Dry  ECG sinus 52 Intervals 14/09/40 Inferolateral T wave inversions RSR prime  Assessment and  Plan  Apical HCM MHY7 + gene   VT-NS  + GAD  SICD    Stable post ICD implantation. The children have been screened echocardiographically. Siblings have been informed

## 2016-11-24 ENCOUNTER — Other Ambulatory Visit: Payer: Self-pay | Admitting: Internal Medicine

## 2016-12-07 DIAGNOSIS — H5213 Myopia, bilateral: Secondary | ICD-10-CM | POA: Diagnosis not present

## 2016-12-18 MED FILL — ATORVASTATIN 10 MG TABLET: 10 | 90 days supply | Qty: 90 | Fill #3

## 2017-01-26 ENCOUNTER — Ambulatory Visit (INDEPENDENT_AMBULATORY_CARE_PROVIDER_SITE_OTHER): Payer: 59 | Admitting: *Deleted

## 2017-01-26 DIAGNOSIS — I422 Other hypertrophic cardiomyopathy: Secondary | ICD-10-CM

## 2017-01-26 NOTE — Progress Notes (Signed)
Remote ICD transmission.   

## 2017-01-27 ENCOUNTER — Encounter: Payer: Self-pay | Admitting: Cardiology

## 2017-01-29 LAB — CUP PACEART REMOTE DEVICE CHECK
Battery Remaining Percentage: 95 %
Date Time Interrogation Session: 20180410190900
Implantable Lead Implant Date: 20170927
Implantable Lead Model: 3401
Implantable Pulse Generator Implant Date: 20170927
MDC IDC LEAD LOCATION: 753858
Pulse Gen Serial Number: 213564

## 2017-03-08 DIAGNOSIS — Z0000 Encounter for general adult medical examination without abnormal findings: Secondary | ICD-10-CM | POA: Diagnosis not present

## 2017-03-08 DIAGNOSIS — K219 Gastro-esophageal reflux disease without esophagitis: Secondary | ICD-10-CM | POA: Diagnosis not present

## 2017-03-08 DIAGNOSIS — E785 Hyperlipidemia, unspecified: Secondary | ICD-10-CM | POA: Diagnosis not present

## 2017-03-08 DIAGNOSIS — I424 Endocardial fibroelastosis: Secondary | ICD-10-CM | POA: Diagnosis not present

## 2017-03-08 DIAGNOSIS — I422 Other hypertrophic cardiomyopathy: Secondary | ICD-10-CM | POA: Diagnosis not present

## 2017-03-23 MED FILL — AZITHROMYCIN 500 MG TABLET: 500 | 3 days supply | Qty: 3 | Fill #0

## 2017-03-26 MED FILL — ATORVASTATIN 10 MG TABLET: 10 | 90 days supply | Qty: 90 | Fill #0

## 2017-04-27 ENCOUNTER — Telehealth: Payer: Self-pay | Admitting: Cardiology

## 2017-04-27 ENCOUNTER — Ambulatory Visit (INDEPENDENT_AMBULATORY_CARE_PROVIDER_SITE_OTHER): Payer: 59 | Admitting: *Deleted

## 2017-04-27 DIAGNOSIS — I422 Other hypertrophic cardiomyopathy: Secondary | ICD-10-CM

## 2017-04-27 NOTE — Telephone Encounter (Signed)
LMOVM reminding pt to send remote transmission.   

## 2017-04-29 NOTE — Progress Notes (Signed)
Remote ICD transmission.   

## 2017-05-04 ENCOUNTER — Encounter: Payer: Self-pay | Admitting: Cardiology

## 2017-05-10 LAB — CUP PACEART REMOTE DEVICE CHECK
Date Time Interrogation Session: 20180711220500
Implantable Pulse Generator Implant Date: 20170927
MDC IDC LEAD IMPLANT DT: 20170927
MDC IDC LEAD LOCATION: 753858
MDC IDC MSMT BATTERY REMAINING PERCENTAGE: 92 %
Pulse Gen Serial Number: 213564

## 2017-05-18 ENCOUNTER — Encounter: Payer: Self-pay | Admitting: Cardiology

## 2017-07-14 MED FILL — ATORVASTATIN 10 MG TABLET: 10 | 90 days supply | Qty: 90 | Fill #1

## 2017-07-29 ENCOUNTER — Ambulatory Visit (INDEPENDENT_AMBULATORY_CARE_PROVIDER_SITE_OTHER): Payer: 59 | Admitting: *Deleted

## 2017-07-29 DIAGNOSIS — I422 Other hypertrophic cardiomyopathy: Secondary | ICD-10-CM | POA: Diagnosis not present

## 2017-07-29 NOTE — Progress Notes (Signed)
Remote ICD transmission.   

## 2017-07-30 ENCOUNTER — Encounter: Payer: Self-pay | Admitting: Cardiology

## 2017-08-09 LAB — CUP PACEART REMOTE DEVICE CHECK
Battery Remaining Percentage: 88 %
Implantable Lead Implant Date: 20170927
MDC IDC LEAD LOCATION: 753858
MDC IDC PG IMPLANT DT: 20170927
MDC IDC SESS DTM: 20181011152900
Pulse Gen Serial Number: 213564

## 2017-08-13 ENCOUNTER — Encounter: Payer: Self-pay | Admitting: Cardiology

## 2017-10-28 ENCOUNTER — Ambulatory Visit (INDEPENDENT_AMBULATORY_CARE_PROVIDER_SITE_OTHER): Payer: No Typology Code available for payment source | Admitting: *Deleted

## 2017-10-28 DIAGNOSIS — I422 Other hypertrophic cardiomyopathy: Secondary | ICD-10-CM

## 2017-10-28 NOTE — Progress Notes (Signed)
Remote ICD transmission.   

## 2017-11-05 ENCOUNTER — Encounter: Payer: Self-pay | Admitting: Cardiology

## 2017-11-15 MED FILL — ATORVASTATIN 10 MG TABLET: 10 | 90 days supply | Qty: 90 | Fill #2

## 2017-11-30 LAB — CUP PACEART REMOTE DEVICE CHECK
Battery Remaining Percentage: 86 %
Implantable Lead Implant Date: 20170927
MDC IDC LEAD LOCATION: 753858
MDC IDC PG IMPLANT DT: 20170927
MDC IDC SESS DTM: 20190110161400
Pulse Gen Serial Number: 213564

## 2018-01-27 ENCOUNTER — Ambulatory Visit (INDEPENDENT_AMBULATORY_CARE_PROVIDER_SITE_OTHER): Payer: No Typology Code available for payment source | Admitting: *Deleted

## 2018-01-27 DIAGNOSIS — I422 Other hypertrophic cardiomyopathy: Secondary | ICD-10-CM

## 2018-01-27 NOTE — Progress Notes (Signed)
Remote ICD transmission.   

## 2018-02-02 ENCOUNTER — Encounter: Payer: Self-pay | Admitting: Cardiology

## 2018-03-01 MED FILL — ATORVASTATIN 10 MG TABLET: 10 | 90 days supply | Qty: 90 | Fill #3

## 2018-03-04 LAB — CUP PACEART REMOTE DEVICE CHECK
Battery Remaining Percentage: 83 %
Implantable Lead Implant Date: 20170927
Implantable Pulse Generator Implant Date: 20170927
MDC IDC LEAD LOCATION: 753858
MDC IDC SESS DTM: 20190411132400
Pulse Gen Serial Number: 213564

## 2018-03-10 ENCOUNTER — Ambulatory Visit: Payer: No Typology Code available for payment source | Admitting: Genetic Counselor
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
## 2018-04-28 ENCOUNTER — Ambulatory Visit (INDEPENDENT_AMBULATORY_CARE_PROVIDER_SITE_OTHER): Payer: No Typology Code available for payment source | Admitting: *Deleted

## 2018-04-28 ENCOUNTER — Telehealth: Payer: Self-pay | Admitting: Cardiology

## 2018-04-28 DIAGNOSIS — I422 Other hypertrophic cardiomyopathy: Secondary | ICD-10-CM

## 2018-04-28 NOTE — Telephone Encounter (Signed)
Spoke with pt and reminded pt of remote transmission that is due today. Pt verbalized understanding.   

## 2018-04-28 NOTE — Progress Notes (Signed)
Remote ICD transmission.   

## 2018-05-31 LAB — CUP PACEART REMOTE DEVICE CHECK
Battery Remaining Percentage: 80 %
Date Time Interrogation Session: 20190711181200
Implantable Lead Implant Date: 20170927
Implantable Lead Location: 753858
Implantable Lead Model: 3401
Implantable Pulse Generator Implant Date: 20170927
MDC IDC PG SERIAL: 213564

## 2018-07-28 ENCOUNTER — Ambulatory Visit (INDEPENDENT_AMBULATORY_CARE_PROVIDER_SITE_OTHER): Payer: No Typology Code available for payment source | Admitting: *Deleted

## 2018-07-28 ENCOUNTER — Telehealth: Payer: Self-pay

## 2018-07-28 DIAGNOSIS — I422 Other hypertrophic cardiomyopathy: Secondary | ICD-10-CM | POA: Diagnosis not present

## 2018-07-28 NOTE — Telephone Encounter (Signed)
Spoke with pt and reminded pt of remote transmission that is due today. Pt verbalized understanding.   

## 2018-08-01 NOTE — Progress Notes (Signed)
Remote ICD transmission.   

## 2018-08-04 ENCOUNTER — Encounter: Payer: Self-pay | Admitting: Cardiology

## 2018-08-16 LAB — CUP PACEART REMOTE DEVICE CHECK
Implantable Lead Implant Date: 20170927
Implantable Lead Location: 753858
Implantable Pulse Generator Implant Date: 20170927
MDC IDC PG SERIAL: 213564
MDC IDC SESS DTM: 20191029084837

## 2018-10-04 ENCOUNTER — Telehealth: Payer: No Typology Code available for payment source | Admitting: Nurse Practitioner

## 2018-10-04 DIAGNOSIS — H00014 Hordeolum externum left upper eyelid: Secondary | ICD-10-CM | POA: Diagnosis not present

## 2018-10-04 MED ORDER — NEOMYCIN-POLYMYXIN-HC OP SUSP: 2.0000 [drp] | Freq: Every day | OPHTHALMIC | 0 refills | Status: DC

## 2018-10-04 MED FILL — NEOMYCIN/POLY/HC EYE DROPS: 3.5-10000-1 | 7 days supply | Qty: 8 | Fill #0

## 2018-10-04 NOTE — Progress Notes (Signed)
We are sorry that you are not feeling well. Here is how we plan to help!  Based on what you have shared with me it looks like you have a stye.  A stye is an inflammation of the eyelid.  It is often a red, painful lump near the edge of the eyelid that may look like a boil or a pimple.  A stye develops when an infection occurs at the base of an eyelash.   We have made appropriate suggestions for you based upon your presentation: Your symptoms may indicate an infection of the sclera.  The use of anti-inflammatory and antibiotic eye drops for a week will help resolve this condition.  I have sent in neomycin-polymyxin HC opthalmic suspension, two to three drops in the affected eye every 4 hours.  If your symptoms do not improve over the next two to three days you should be seen in your doctor's office.  HOME CARE:   Wash your hands often!  Let the stye open on its own. Don't squeeze or open it.  Don't rub your eyes. This can irritate your eyes and let in bacteria.  If you need to touch your eyes, wash your hands first.  Don't wear eye makeup or contact lenses until the area has healed.  GET HELP RIGHT AWAY IF:   Your symptoms do not improve.  You develop blurred or loss of vision.  Your symptoms worsen (increased discharge, pain or redness).  Thank you for choosing an e-visit.  Your e-visit answers were reviewed by a board certified advanced clinical practitioner to complete your personal care plan.  Depending upon the condition, your plan could have included both over the counter or prescription medications.  Please review your pharmacy choice.  Make sure the pharmacy is open so you can pick up prescription now.  If there is a problem, you may contact your provider through MyChart messaging and have the prescription routed to another pharmacy.    Your safety is important to us.  If you have drug allergies check your prescription carefully.  For the next 24 hours you can use MyChart to  ask questions about today's visit, request a non-urgent call back, or ask for a work or school excuse.  You will get an email in the next two days asking about your experience.  I hope you that your e-visit has been valuable and will speed your recovery.      

## 2018-10-06 MED ORDER — ERYTHROMYCIN 5 MG/GM OP OINT: TOPICAL_OINTMENT | OPHTHALMIC | 0 refills | Status: DC

## 2018-10-06 MED FILL — ERYTHROMYCIN EYE OINTMENT: 5 | 5 days supply | Qty: 4 | Fill #0

## 2018-10-06 NOTE — Addendum Note (Signed)
Addended by: Waldon MerlMARTIN, Arriah Wadle C on: 10/06/2018 01:54 PM   Modules accepted: Orders

## 2018-10-27 ENCOUNTER — Ambulatory Visit (INDEPENDENT_AMBULATORY_CARE_PROVIDER_SITE_OTHER): Payer: No Typology Code available for payment source

## 2018-10-27 DIAGNOSIS — Z95810 Presence of automatic (implantable) cardiac defibrillator: Secondary | ICD-10-CM

## 2018-10-27 DIAGNOSIS — I422 Other hypertrophic cardiomyopathy: Secondary | ICD-10-CM | POA: Diagnosis not present

## 2018-10-28 NOTE — Progress Notes (Signed)
Remote ICD transmission.   

## 2018-10-29 LAB — CUP PACEART REMOTE DEVICE CHECK
Battery Remaining Percentage: 75 %
Implantable Lead Model: 3401
MDC IDC LEAD IMPLANT DT: 20170927
MDC IDC LEAD LOCATION: 753858
MDC IDC PG IMPLANT DT: 20170927
MDC IDC PG SERIAL: 213564
MDC IDC SESS DTM: 20200109180700

## 2019-01-26 ENCOUNTER — Ambulatory Visit (INDEPENDENT_AMBULATORY_CARE_PROVIDER_SITE_OTHER): Payer: No Typology Code available for payment source | Admitting: *Deleted

## 2019-01-26 ENCOUNTER — Other Ambulatory Visit: Payer: Self-pay

## 2019-01-26 DIAGNOSIS — I422 Other hypertrophic cardiomyopathy: Secondary | ICD-10-CM

## 2019-01-26 LAB — CUP PACEART REMOTE DEVICE CHECK
Battery Remaining Percentage: 72 %
Date Time Interrogation Session: 20200409131300
Implantable Lead Implant Date: 20170927
Implantable Lead Location: 753858
Implantable Lead Model: 3401
Implantable Pulse Generator Implant Date: 20170927
Pulse Gen Serial Number: 213564

## 2019-02-06 ENCOUNTER — Encounter: Payer: Self-pay | Admitting: Cardiology

## 2019-02-06 NOTE — Progress Notes (Signed)
Remote ICD transmission.   

## 2019-04-27 ENCOUNTER — Encounter: Payer: No Typology Code available for payment source | Admitting: *Deleted

## 2019-04-28 ENCOUNTER — Telehealth: Payer: Self-pay

## 2019-04-28 NOTE — Telephone Encounter (Signed)
Left message for patient to remind of missed remote transmission.  

## 2019-05-05 ENCOUNTER — Ambulatory Visit (INDEPENDENT_AMBULATORY_CARE_PROVIDER_SITE_OTHER): Payer: No Typology Code available for payment source | Admitting: *Deleted

## 2019-05-05 DIAGNOSIS — I422 Other hypertrophic cardiomyopathy: Secondary | ICD-10-CM | POA: Diagnosis not present

## 2019-05-05 LAB — CUP PACEART REMOTE DEVICE CHECK
Battery Remaining Percentage: 69 %
Date Time Interrogation Session: 20200716131600
Implantable Lead Implant Date: 20170927
Implantable Lead Location: 753858
Implantable Lead Model: 3401
Implantable Pulse Generator Implant Date: 20170927
Pulse Gen Serial Number: 213564

## 2019-05-10 ENCOUNTER — Encounter: Payer: Self-pay | Admitting: Cardiology

## 2019-05-10 NOTE — Progress Notes (Signed)
Remote ICD transmission.   

## 2019-07-20 ENCOUNTER — Telehealth: Payer: Self-pay

## 2019-07-20 NOTE — Telephone Encounter (Signed)
I called pt because we received an unscheduled transmission from his home remote monitor.

## 2019-07-28 MED FILL — ATORVASTATIN 10 MG TABLET: 10 | 90 days supply | Qty: 90 | Fill #0

## 2019-08-04 ENCOUNTER — Encounter: Payer: No Typology Code available for payment source | Admitting: *Deleted

## 2019-08-14 ENCOUNTER — Ambulatory Visit (INDEPENDENT_AMBULATORY_CARE_PROVIDER_SITE_OTHER): Payer: No Typology Code available for payment source | Admitting: *Deleted

## 2019-08-14 DIAGNOSIS — I422 Other hypertrophic cardiomyopathy: Secondary | ICD-10-CM

## 2019-08-14 DIAGNOSIS — I4729 Other ventricular tachycardia: Secondary | ICD-10-CM

## 2019-08-14 DIAGNOSIS — I472 Ventricular tachycardia: Secondary | ICD-10-CM | POA: Diagnosis not present

## 2019-08-15 LAB — CUP PACEART REMOTE DEVICE CHECK
Battery Remaining Percentage: 66 %
Date Time Interrogation Session: 20201026135200
Implantable Lead Implant Date: 20170927
Implantable Lead Location: 753858
Implantable Lead Model: 3401
Implantable Pulse Generator Implant Date: 20170927
Pulse Gen Serial Number: 213564

## 2019-08-31 ENCOUNTER — Encounter: Payer: Self-pay | Admitting: Cardiology

## 2019-09-01 NOTE — Progress Notes (Signed)
Remote ICD transmission.   

## 2019-09-18 MED FILL — ATORVASTATIN 20 MG TABLET: 20 | 90 days supply | Qty: 90 | Fill #0

## 2019-10-22 DIAGNOSIS — Z95810 Presence of automatic (implantable) cardiac defibrillator: Secondary | ICD-10-CM | POA: Insufficient documentation

## 2019-10-24 ENCOUNTER — Other Ambulatory Visit: Payer: Self-pay

## 2019-10-24 ENCOUNTER — Ambulatory Visit (INDEPENDENT_AMBULATORY_CARE_PROVIDER_SITE_OTHER): Payer: No Typology Code available for payment source | Admitting: Internal Medicine

## 2019-10-24 ENCOUNTER — Encounter: Payer: Self-pay | Admitting: Internal Medicine

## 2019-10-24 VITALS — BP 132/86 | HR 57 | Ht 68.0 in

## 2019-10-24 DIAGNOSIS — I472 Ventricular tachycardia: Secondary | ICD-10-CM | POA: Diagnosis not present

## 2019-10-24 DIAGNOSIS — Z95810 Presence of automatic (implantable) cardiac defibrillator: Secondary | ICD-10-CM | POA: Diagnosis not present

## 2019-10-24 DIAGNOSIS — I422 Other hypertrophic cardiomyopathy: Secondary | ICD-10-CM

## 2019-10-24 DIAGNOSIS — I4729 Other ventricular tachycardia: Secondary | ICD-10-CM

## 2019-10-24 NOTE — Patient Instructions (Signed)
Medication Instructions:  Your physician recommends that you continue on your current medications as directed. Please refer to the Current Medication list given to you today.  Labwork: None ordered.  Testing/Procedures: None ordered.  Follow-Up: Your physician wants you to follow-up in:  One year with Dr Klein. You will receive a reminder letter in the mail two months in advance. If you don't receive a letter, please call our office to schedule the follow-up appointment.  Remote monitoring is used to monitor your Pacemaker of ICD from home. This monitoring reduces the number of office visits required to check your device to one time per year. It allows us to keep an eye on the functioning of your device to ensure it is working properly.  Any Other Special Instructions Will Be Listed Below (If Applicable).  If you need a refill on your cardiac medications before your next appointment, please call your pharmacy.   

## 2019-10-24 NOTE — Progress Notes (Signed)
        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  He has a history of nonsustained ventricular tachycardia.  He had gadolinium enhancement diffusely in the septum and apex and the anterior and inferior walls but without evidence of apical ballooning. He is status post S ICD implantation 9/17  The patient denies chest pain, shortness of breath, nocturnal dyspnea, orthopnea or peripheral edema.  There have been no palpitations, lightheadedness or syncope.    BP a littl elevated at home  Records and Results Reviewed Consultation notes from Dr. Regino Schultze denies any symptoms 6/17 and genetic reports demonstrated MYH7  Gene abnormality    Past Medical History:  Diagnosis Date  . Apical variant hypertrophic cardiomyopathy (HCC)   . Elevated troponin   . Hypercholesterolemia   . NSVT (nonsustained ventricular tachycardia) (HCC)     Past Surgical History:  Procedure Laterality Date  . EP IMPLANTABLE DEVICE N/A 07/15/2016   Procedure: SubQ ICD Implant;  Surgeon: Duke Salvia, MD;  Location: [facility] INVASIVE CV LAB;  Service: Cardiovascular;  Laterality: N/A;  . KNEE SURGERY      Current Outpatient Medications  Medication Sig Dispense Refill  . atorvastatin (LIPITOR) 20 MG tablet Take 20 mg by mouth daily.    . diphenhydrAMINE (BENADRYL) 25 MG tablet Take 25-50 mg by mouth at bedtime as needed for allergies or sleep.    Marland Kitchen erythromycin ophthalmic ointment Apply a small amount to the effective three times daily for five days. 3.5 g 0  . fluticasone (FLONASE) 50 MCG/ACT nasal spray Place 2 sprays into both nostrils daily as needed for allergies or rhinitis.    . naproxen sodium (RA NAPROXEN SODIUM) 220 MG tablet Take 2 tablets by mouth as needed. For pain     No current facility-administered medications for this  visit.    No Known Allergies    Review of Systems negative except from HPI and PMH  Physical Exam BP 132/86   Pulse (!) 57   Ht 5\' 8"  (1.727 m)   SpO2 99%   BMI 23.75 kg/m  Well developed and well nourished in no acute distress HENT normal Neck supple with JVP-flat Clear Device pocket well healed; without hematoma or erythema.  There is no tethering  Regular rate and rhythm, no  murmur Abd-soft with active BS No Clubbing cyanosis  edema Skin-warm and dry A & Oriented  Grossly normal sensory and motor function  ECG  STT changes 2, 3, F, V4-V6 Saddleback ST elevation V1-V2   Assessment and  Plan  Apical HCM MHY7 + gene   VT-NS  + GAD  SICD  Abnormal ECG  Elevated blood pressure   .  No symptoms.  Device function is normal.  Blood pressures have been in the 130-140/85-90 range.  If they remain somewhat elevated we will begin him on ARB.

## 2019-10-25 LAB — CUP PACEART INCLINIC DEVICE CHECK
Date Time Interrogation Session: 20210106110503
Implantable Lead Implant Date: 20170927
Implantable Lead Location: 753862
Implantable Lead Model: 3401
Implantable Pulse Generator Implant Date: 20170927
Pulse Gen Serial Number: 213564

## 2019-11-02 ENCOUNTER — Other Ambulatory Visit: Payer: No Typology Code available for payment source

## 2019-11-13 ENCOUNTER — Ambulatory Visit (INDEPENDENT_AMBULATORY_CARE_PROVIDER_SITE_OTHER): Payer: No Typology Code available for payment source | Admitting: *Deleted

## 2019-11-13 DIAGNOSIS — I4729 Other ventricular tachycardia: Secondary | ICD-10-CM

## 2019-11-13 DIAGNOSIS — I472 Ventricular tachycardia: Secondary | ICD-10-CM

## 2019-11-14 ENCOUNTER — Telehealth: Payer: Self-pay

## 2019-11-14 LAB — CUP PACEART REMOTE DEVICE CHECK
Battery Remaining Percentage: 63 %
Date Time Interrogation Session: 20210126112000
Implantable Lead Implant Date: 20170927
Implantable Lead Location: 753862
Implantable Lead Model: 3401
Implantable Pulse Generator Implant Date: 20170927
Pulse Gen Serial Number: 213564

## 2019-11-14 NOTE — Telephone Encounter (Signed)
Spoke with patient to remind of missed remote transmission 

## 2019-11-16 LAB — CUP PACEART REMOTE DEVICE CHECK
Battery Remaining Percentage: 63 %
Date Time Interrogation Session: 20210128082800
Implantable Lead Implant Date: 20170927
Implantable Lead Location: 753862
Implantable Lead Model: 3401
Implantable Pulse Generator Implant Date: 20170927
Pulse Gen Serial Number: 213564

## 2019-12-21 MED FILL — ATORVASTATIN 20 MG TABLET: 20 | 90 days supply | Qty: 90 | Fill #1

## 2020-02-12 ENCOUNTER — Ambulatory Visit (INDEPENDENT_AMBULATORY_CARE_PROVIDER_SITE_OTHER): Payer: No Typology Code available for payment source | Admitting: *Deleted

## 2020-02-12 DIAGNOSIS — I472 Ventricular tachycardia: Secondary | ICD-10-CM

## 2020-02-12 DIAGNOSIS — I4729 Other ventricular tachycardia: Secondary | ICD-10-CM

## 2020-02-13 ENCOUNTER — Telehealth: Payer: Self-pay

## 2020-02-13 ENCOUNTER — Telehealth (HOSPITAL_COMMUNITY): Payer: Self-pay | Admitting: Internal Medicine

## 2020-02-13 DIAGNOSIS — I422 Other hypertrophic cardiomyopathy: Secondary | ICD-10-CM

## 2020-02-13 NOTE — Telephone Encounter (Signed)
I spoke to Dr. Gerome Sam and he would like to know when he needs his next Echo or any other follow up imaging. He said he's messaged the clinic directly but hasn't heard back yet.   Thank you! Selena Pitsch [employer]

## 2020-02-13 NOTE — Telephone Encounter (Signed)
Spoke with patient to remind of missed remote transmission 

## 2020-02-13 NOTE — Telephone Encounter (Signed)
Per Dr Graciela Husbands request, echo order placed and contacted pt and advised he will be receiving a call to schedule echo. Pt verbalizes understanding and agrees with current plan.

## 2020-02-13 NOTE — Telephone Encounter (Signed)
M  could you plz arrange an echo for f/u for Dr Mayford Knife for his apical HCM   Thanks SK

## 2020-02-13 NOTE — Telephone Encounter (Signed)
I called patient to schedule echocardiogram and he states that he didn't necessarily want one he just wanted to know when he may need one again and how far in between.. Please call him.

## 2020-02-13 NOTE — Telephone Encounter (Signed)
Spoke with Bradley Castaneda who is asking when Dr Graciela Husbands would like to repeat echo.  Bradley Castaneda denies current problems just wanting to follow up as what recommendation for imaging is.  Bradley Castaneda advised Dr Graciela Husbands is out of office  until 02/19/2020.  Will forward Bradley Castaneda's question to Dr Graciela Husbands for review.  Bradley Castaneda verbalizes understanding and agrees with current plan.

## 2020-02-15 LAB — CUP PACEART REMOTE DEVICE CHECK
Battery Remaining Percentage: 60 %
Date Time Interrogation Session: 20210429095400
Implantable Lead Implant Date: 20170927
Implantable Lead Location: 753862
Implantable Lead Model: 3401
Implantable Pulse Generator Implant Date: 20170927
Pulse Gen Serial Number: 213564

## 2020-02-15 NOTE — Progress Notes (Signed)
ICD Remote  

## 2020-05-13 ENCOUNTER — Ambulatory Visit (INDEPENDENT_AMBULATORY_CARE_PROVIDER_SITE_OTHER): Payer: No Typology Code available for payment source | Admitting: *Deleted

## 2020-05-13 DIAGNOSIS — I422 Other hypertrophic cardiomyopathy: Secondary | ICD-10-CM

## 2020-05-21 LAB — CUP PACEART REMOTE DEVICE CHECK
Battery Remaining Percentage: 57 %
Date Time Interrogation Session: 20210802095800
Implantable Lead Implant Date: 20170927
Implantable Lead Location: 753862
Implantable Lead Model: 3401
Implantable Pulse Generator Implant Date: 20170927
Pulse Gen Serial Number: 213564

## 2020-05-24 NOTE — Progress Notes (Signed)
Remote ICD transmission.   

## 2020-06-25 MED FILL — METHYLPREDNISOLONE 4 MG TAB: 4 | 6 days supply | Qty: 21 | Fill #0

## 2020-07-08 MED FILL — ATORVASTATIN 20 MG TABLET: 20 | 90 days supply | Qty: 90 | Fill #3

## 2020-08-08 ENCOUNTER — Telehealth: Payer: Self-pay

## 2020-08-08 NOTE — Telephone Encounter (Signed)
NOTES ON FILE FROM DR RUPASHREE VARADARAGAN, REFERRAL ALREADY IN

## 2020-08-12 ENCOUNTER — Ambulatory Visit (INDEPENDENT_AMBULATORY_CARE_PROVIDER_SITE_OTHER): Payer: No Typology Code available for payment source

## 2020-08-12 DIAGNOSIS — I422 Other hypertrophic cardiomyopathy: Secondary | ICD-10-CM | POA: Diagnosis not present

## 2020-08-13 LAB — CUP PACEART REMOTE DEVICE CHECK
Battery Remaining Percentage: 54 %
Date Time Interrogation Session: 20211026152000
Implantable Lead Implant Date: 20170927
Implantable Lead Location: 753862
Implantable Lead Model: 3401
Implantable Pulse Generator Implant Date: 20170927
Pulse Gen Serial Number: 213564

## 2020-08-15 NOTE — Progress Notes (Signed)
Remote ICD transmission.   

## 2020-09-27 ENCOUNTER — Other Ambulatory Visit (HOSPITAL_COMMUNITY): Payer: Self-pay | Admitting: Internal Medicine

## 2020-09-27 MED FILL — ATORVASTATIN CALCIUM 20 MG: 20 | 90 days supply | Qty: 90 | Fill #0

## 2020-11-11 ENCOUNTER — Ambulatory Visit (INDEPENDENT_AMBULATORY_CARE_PROVIDER_SITE_OTHER): Payer: No Typology Code available for payment source

## 2020-11-11 DIAGNOSIS — I472 Ventricular tachycardia: Secondary | ICD-10-CM

## 2020-11-11 DIAGNOSIS — I422 Other hypertrophic cardiomyopathy: Secondary | ICD-10-CM | POA: Diagnosis not present

## 2020-11-11 DIAGNOSIS — I4729 Other ventricular tachycardia: Secondary | ICD-10-CM

## 2020-11-16 LAB — CUP PACEART REMOTE DEVICE CHECK
Battery Remaining Percentage: 51 %
Date Time Interrogation Session: 20220129084600
Implantable Lead Implant Date: 20170927
Implantable Lead Location: 753862
Implantable Lead Model: 3401
Implantable Pulse Generator Implant Date: 20170927
Pulse Gen Serial Number: 213564

## 2020-11-21 NOTE — Progress Notes (Signed)
Remote ICD transmission.   

## 2021-01-08 MED FILL — ATORVASTATIN CALCIUM 20 MG: 20 | 90 days supply | Qty: 90 | Fill #1

## 2021-01-14 ENCOUNTER — Other Ambulatory Visit: Payer: Self-pay

## 2021-01-14 ENCOUNTER — Ambulatory Visit (HOSPITAL_COMMUNITY): Payer: No Typology Code available for payment source | Attending: Internal Medicine

## 2021-01-14 DIAGNOSIS — I422 Other hypertrophic cardiomyopathy: Secondary | ICD-10-CM | POA: Diagnosis not present

## 2021-01-14 MED ORDER — PERFLUTREN LIPID MICROSPHERE
1.0000 mL | INTRAVENOUS | Status: AC | PRN
Start: 2021-01-14 — End: 2021-01-14
  Administered 2021-01-14: 1 mL via INTRAVENOUS

## 2021-01-15 LAB — ECHOCARDIOGRAM COMPLETE
Area-P 1/2: 3.65 cm2
S' Lateral: 3.1 cm

## 2021-01-21 ENCOUNTER — Other Ambulatory Visit (HOSPITAL_COMMUNITY): Payer: Self-pay | Admitting: *Deleted

## 2021-01-23 ENCOUNTER — Other Ambulatory Visit: Payer: Self-pay

## 2021-01-23 ENCOUNTER — Ambulatory Visit (HOSPITAL_BASED_OUTPATIENT_CLINIC_OR_DEPARTMENT_OTHER)
Admission: RE | Admit: 2021-01-23 | Discharge: 2021-01-23 | Disposition: A | Discharge status: Home / Self Care | Payer: No Typology Code available for payment source | Source: Ambulatory Visit | Attending: Cardiology | Admitting: Cardiology

## 2021-01-23 ENCOUNTER — Encounter (HOSPITAL_BASED_OUTPATIENT_CLINIC_OR_DEPARTMENT_OTHER): Payer: Self-pay

## 2021-02-17 ENCOUNTER — Ambulatory Visit (INDEPENDENT_AMBULATORY_CARE_PROVIDER_SITE_OTHER): Payer: No Typology Code available for payment source

## 2021-02-17 DIAGNOSIS — I472 Ventricular tachycardia: Secondary | ICD-10-CM

## 2021-02-17 DIAGNOSIS — I4729 Other ventricular tachycardia: Secondary | ICD-10-CM

## 2021-02-19 LAB — CUP PACEART REMOTE DEVICE CHECK
Battery Remaining Percentage: 48 %
Date Time Interrogation Session: 20220502083400
Implantable Lead Implant Date: 20170927
Implantable Lead Location: 753862
Implantable Lead Model: 3401
Implantable Pulse Generator Implant Date: 20170927
Pulse Gen Serial Number: 213564

## 2021-03-10 NOTE — Progress Notes (Signed)
Remote ICD transmission.   

## 2021-04-17 ENCOUNTER — Other Ambulatory Visit (HOSPITAL_COMMUNITY): Payer: Self-pay

## 2021-04-17 MED FILL — Atorvastatin Calcium Tab 20 MG (Base Equivalent): ORAL | 90 days supply | Qty: 90 | Fill #0 | Status: AC

## 2021-04-18 ENCOUNTER — Other Ambulatory Visit: Payer: Self-pay

## 2021-04-18 ENCOUNTER — Other Ambulatory Visit (HOSPITAL_COMMUNITY): Payer: Self-pay

## 2021-04-18 DIAGNOSIS — I4891 Unspecified atrial fibrillation: Secondary | ICD-10-CM

## 2021-04-18 MED ORDER — APIXABAN 5 MG PO TABS
5.0000 mg | ORAL_TABLET | Freq: Two times a day (BID) | ORAL | 3 refills | Status: DC
  Filled 2021-04-18: qty 60, 30d supply, fill #0
  Filled 2021-05-22: qty 60, 30d supply, fill #1
  Filled 2021-06-24: qty 180, 90d supply, fill #1
  Filled 2021-10-01: qty 180, 90d supply, fill #2
  Filled 2022-01-12: qty 180, 90d supply, fill #0
  Filled 2022-04-06: qty 120, 60d supply, fill #1

## 2021-04-18 NOTE — Progress Notes (Signed)
Per Dr Graciela Husbands pt needs to begin Eliquis 5mg  - 1 tablet by mouth twice daily for new Afib along with echo, referral to Afib clinic and DCCV in 3 weeks.  Pt is aware of orders and will contact his PCP for updated referral.  DCCV scheduled for 05/12/2021 with Dr 05/14/2021 at 8am at [facility]

## 2021-04-22 ENCOUNTER — Ambulatory Visit (HOSPITAL_COMMUNITY): Payer: No Typology Code available for payment source | Attending: Cardiology

## 2021-04-22 ENCOUNTER — Other Ambulatory Visit: Payer: Self-pay

## 2021-04-22 DIAGNOSIS — I4891 Unspecified atrial fibrillation: Secondary | ICD-10-CM | POA: Insufficient documentation

## 2021-04-22 LAB — ECHOCARDIOGRAM COMPLETE
Area-P 1/2: 1.6 cm2
S' Lateral: 3 cm

## 2021-05-07 ENCOUNTER — Other Ambulatory Visit: Payer: Self-pay

## 2021-05-07 ENCOUNTER — Ambulatory Visit (HOSPITAL_COMMUNITY)
Admission: RE | Admit: 2021-05-07 | Discharge: 2021-05-07 | Disposition: A | Discharge status: Home / Self Care | Payer: No Typology Code available for payment source | Source: Ambulatory Visit | Attending: Nurse Practitioner | Admitting: Nurse Practitioner

## 2021-05-07 ENCOUNTER — Encounter (HOSPITAL_COMMUNITY): Payer: Self-pay | Admitting: Nurse Practitioner

## 2021-05-07 VITALS — BP 144/90 | HR 59 | Ht 68.0 in | Wt 175.0 lb

## 2021-05-07 DIAGNOSIS — Z79899 Other long term (current) drug therapy: Secondary | ICD-10-CM | POA: Insufficient documentation

## 2021-05-07 DIAGNOSIS — Z8249 Family history of ischemic heart disease and other diseases of the circulatory system: Secondary | ICD-10-CM | POA: Insufficient documentation

## 2021-05-07 DIAGNOSIS — D6869 Other thrombophilia: Secondary | ICD-10-CM | POA: Diagnosis not present

## 2021-05-07 DIAGNOSIS — I4891 Unspecified atrial fibrillation: Secondary | ICD-10-CM | POA: Insufficient documentation

## 2021-05-07 DIAGNOSIS — Z95818 Presence of other cardiac implants and grafts: Secondary | ICD-10-CM | POA: Insufficient documentation

## 2021-05-07 DIAGNOSIS — Z95810 Presence of automatic (implantable) cardiac defibrillator: Secondary | ICD-10-CM | POA: Diagnosis not present

## 2021-05-07 DIAGNOSIS — Z7901 Long term (current) use of anticoagulants: Secondary | ICD-10-CM | POA: Diagnosis not present

## 2021-05-07 DIAGNOSIS — I422 Other hypertrophic cardiomyopathy: Secondary | ICD-10-CM

## 2021-05-07 LAB — BASIC METABOLIC PANEL
Anion gap: 7 (ref 5–15)
BUN: 8 mg/dL (ref 6–20)
CO2: 25 mmol/L (ref 22–32)
Calcium: 7.2 mg/dL — ABNORMAL LOW (ref 8.9–10.3)
Chloride: 107 mmol/L (ref 98–111)
Creatinine, Ser: 1.34 mg/dL — ABNORMAL HIGH (ref 0.61–1.24)
GFR, Estimated: >60 mL/min (ref >60)
Glucose, Bld: 104 mg/dL — ABNORMAL HIGH (ref 70–99)
Potassium: 3.4 mmol/L — ABNORMAL LOW (ref 3.5–5.1)
Sodium: 139 mmol/L (ref 135–145)

## 2021-05-07 LAB — CBC
HCT: 45.5 % (ref 39.0–52.0)
Hemoglobin: 15 g/dL (ref 13.0–17.0)
MCH: 30.2 pg (ref 26.0–34.0)
MCHC: 33 g/dL (ref 30.0–36.0)
MCV: 91.7 fL (ref 80.0–100.0)
Platelets: 243 10*3/uL (ref 150–400)
RBC: 4.96 MIL/uL (ref 4.22–5.81)
RDW: 12.9 % (ref 11.5–15.5)
WBC: 3.6 10*3/uL — ABNORMAL LOW (ref 4.0–10.5)
nRBC: 0 % (ref 0.0–0.2)

## 2021-05-07 NOTE — Progress Notes (Signed)
Primary Care Physician: Leia Alf, MD (Inactive) Referring Physician: Dr. Vanessa Ralphs, MD is a 50 y.o. male with a h/o apical HCM without evidence of outflow obstruction. He has h/o  nonsustained v tach. He is status post  ICD implantation 06/2016.  He first noted afib by his watch in June after riding his bike, which lasted 2 hours. It next occurred early July  after playing golf on a hot day that lasted 24 hrs. He discussed with Dr. Graciela Husbands after several hours of ongoing afib and he started eliquis 5 mg bid, ordered echo and set up a cardioversion for which he is in the afib clinic to get labs, H/P. He however has now converted. Echo showed normal EF and left atrial size with mild concentric Left ventricular hypertrophy. Afib episodes were rate controlled in the 80's and baseline SR is in the 50's/60's.   Today, he denies symptoms of palpitations, chest pain, shortness of breath, orthopnea, PND, lower extremity edema, dizziness, presyncope, syncope, or neurologic sequela. The patient is tolerating medications without difficulties and is otherwise without complaint today.   Past Medical History:  Diagnosis Date   Apical variant hypertrophic cardiomyopathy (HCC)    Elevated troponin    Hypercholesterolemia    NSVT (nonsustained ventricular tachycardia) (HCC)    Past Surgical History:  Procedure Laterality Date   EP IMPLANTABLE DEVICE N/A 07/15/2016   Procedure: SubQ ICD Implant;  Surgeon: Duke Salvia, MD;  Location: [facility] INVASIVE CV LAB;  Service: Cardiovascular;  Laterality: N/A;   KNEE SURGERY      Current Outpatient Medications  Medication Sig Dispense Refill   apixaban (ELIQUIS) 5 MG TABS tablet Take 1 tablet (5 mg total) by mouth 2 (two) times daily. 180 tablet 3   atorvastatin (LIPITOR) 20 MG tablet TAKE 1 TABLET BY MOUTH ONCE DAILY 90 tablet 3   diphenhydrAMINE (BENADRYL) 25 MG tablet Take 25-50 mg by mouth at bedtime as needed for allergies or sleep.      loratadine (CLARITIN) 10 MG tablet Take 10 mg by mouth 3 (three) times a week.     naproxen sodium (ALEVE) 220 MG tablet Take 220-410 mg by mouth daily as needed (pain).     oxymetazoline (AFRIN) 0.05 % nasal spray Place 1 spray into both nostrils See admin instructions. Three-five times a week at night     psyllium (METAMUCIL) 58.6 % powder Take 1 packet by mouth 3 (three) times daily.     No current facility-administered medications for this encounter.    No Known Allergies  Social History   Socioeconomic History   Marital status: Married    Spouse name: Not on file   Number of children: Not on file   Years of education: Not on file   Highest education level: Not on file  Occupational History   Not on file  Tobacco Use   Smoking status: Never   Smokeless tobacco: Never  Substance and Sexual Activity   Alcohol use: Yes    Alcohol/week: 1.0 - 2.0 standard drink    Types: 1 - 2 Standard drinks or equivalent per week    Comment: occasional   Drug use: No   Sexual activity: Not on file  Other Topics Concern   Not on file  Social History Narrative   Not on file   Social Determinants of Health   Financial Resource Strain: Not on file  Food Insecurity: Not on file  Transportation Needs: Not on file  Physical Activity: Not on file  Stress: Not on file  Social Connections: Not on file  Intimate Partner Violence: Not on file    Family History  Problem Relation Age of Onset   Heart disease Father        HCM-IHSS   Diabetes Father    Hypertension Father    Hypertension Mother    CAD Mother    Hypertension Sister     ROS- All systems are reviewed and negative except as per the HPI above  Physical Exam: Vitals:   05/07/21 0941  BP: (!) 144/90  Pulse: (!) 59  Weight: 79.4 kg  Height: 5\' 8"  (1.727 m)   Wt Readings from Last 3 Encounters:  05/07/21 79.4 kg  10/27/16 70.9 kg  07/15/16 68 kg    Labs: Lab Results  Component Value Date   NA 139 05/07/2021    K 3.4 (L) 05/07/2021   CL 107 05/07/2021   CO2 25 05/07/2021   GLUCOSE 104 (H) 05/07/2021   BUN 8 05/07/2021   CREATININE 1.34 (H) 05/07/2021   CALCIUM 7.2 (L) 05/07/2021   PHOS 3.4 11/08/2015   MG 1.8 11/09/2015   Lab Results  Component Value Date   INR 1.1 07/08/2016   Lab Results  Component Value Date   CHOL 147 11/09/2015   HDL 47 11/09/2015   LDLCALC 81 11/09/2015   TRIG 94 11/09/2015     GEN- The patient is well appearing, alert and oriented x 3 today.   Head- normocephalic, atraumatic Eyes-  Sclera clear, conjunctiva pink Ears- hearing intact Oropharynx- clear Neck- supple, no JVP Lymph- no cervical lymphadenopathy Lungs- Clear to ausculation bilaterally, normal work of breathing Heart- Regular rate and rhythm, no murmurs, rubs or gallops, PMI not laterally displaced GI- soft, NT, ND, + BS Extremities- no clubbing, cyanosis, or edema MS- no significant deformity or atrophy Skin- no rash or lesion Psych- euthymic mood, full affect Neuro- strength and sensation are intact  EKG-Sinus brady at 59 bpm, pr int 152 ms, qrs int 96 ms, qtc 419 ms  Strips reviewed by phone that show afib      Assessment and Plan:  1. New onset afib  He is now back in SR with afib lasting 24 hours General education re afib  Cardioversion cancelled  I discussed with Dr. 11/11/2015 and with afib and HCM will stay on eliquis 5 mg bid  If his afib escalates, he will be a front line ablation candidate  Labs today revealed K+ at 3.4 with increase of creatinine to 1.34. Pt. states that he moved the grass yesterday and possibly with sweating dropped his K+. He will increase K in the diet and drink some extra water today.   Pt will continue to monitor on his watch  I will get a f/u with Dr. Graciela Husbands in 2-3 months  Afib clinic as needed   Graciela Husbands C. [facility] [address] [place], [place] [phone]

## 2021-05-12 ENCOUNTER — Ambulatory Visit (HOSPITAL_COMMUNITY)
Admission: RE | Admit: 2021-05-12 | Payer: No Typology Code available for payment source | Source: Home / Self Care | Admitting: Internal Medicine

## 2021-05-12 ENCOUNTER — Encounter (HOSPITAL_COMMUNITY): Admission: RE | Payer: Self-pay | Source: Home / Self Care

## 2021-05-12 SURGERY — CARDIOVERSION
Anesthesia: General

## 2021-05-23 ENCOUNTER — Other Ambulatory Visit (HOSPITAL_COMMUNITY): Payer: Self-pay

## 2021-05-23 ENCOUNTER — Ambulatory Visit (INDEPENDENT_AMBULATORY_CARE_PROVIDER_SITE_OTHER): Payer: No Typology Code available for payment source

## 2021-05-23 DIAGNOSIS — I422 Other hypertrophic cardiomyopathy: Secondary | ICD-10-CM

## 2021-05-27 ENCOUNTER — Other Ambulatory Visit (HOSPITAL_BASED_OUTPATIENT_CLINIC_OR_DEPARTMENT_OTHER): Payer: Self-pay

## 2021-05-27 ENCOUNTER — Other Ambulatory Visit: Payer: Self-pay

## 2021-05-27 ENCOUNTER — Ambulatory Visit: Payer: No Typology Code available for payment source | Attending: Internal Medicine

## 2021-05-27 DIAGNOSIS — Z23 Encounter for immunization: Secondary | ICD-10-CM

## 2021-05-27 MED ORDER — PFIZER-BIONT COVID-19 VAC-TRIS 30 MCG/0.3ML IM SUSP
INTRAMUSCULAR | 0 refills | Status: DC
  Filled 2021-05-27: qty 0.3, 1d supply, fill #0

## 2021-05-27 NOTE — Progress Notes (Signed)
   Covid-19 Vaccination Clinic  Name:  Bradley DAVIES, MD    MRN: [id number] DOB: Mar 26, 1971  05/27/2021  Mr. Bisceglia was observed post Covid-19 immunization for 15 minutes without incident. He was provided with Vaccine Information Sheet and instruction to access the V-Safe system.   Mr. Craun was instructed to call 911 with any severe reactions post vaccine: Difficulty breathing  Swelling of face and throat  A fast heartbeat  A bad rash all over body  Dizziness and weakness   Immunizations Administered     Name Date Dose VIS Date Route   PFIZER Comrnaty(Gray TOP) Covid-19 Vaccine 05/27/2021  3:45 PM 0.3 mL 09/26/2020 Intramuscular   Manufacturer: [employer], [employer]   Lot: [id number]   NDC: [phone]

## 2021-06-02 ENCOUNTER — Other Ambulatory Visit (HOSPITAL_COMMUNITY): Payer: Self-pay

## 2021-06-07 NOTE — Progress Notes (Signed)
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   Sent to AF clinic 05/07/21 for new onset AF and to plan [facility]. Pt presented that day in NSR so [facility] cancelled.   Since last being seen in our clinic the patient reports doing well. Has not felt further AF. Has been having issues getting his home monitor to send. Maintaining NSR by his watch and today by Lourena Simmonds.  he denies chest pain, palpitations, dyspnea, PND, orthopnea, nausea, vomiting, dizziness, syncope, edema, weight gain, or early satiety. He has not had ICD shocks.   Device History: [employer] S-ICD ICD implanted 06/2016 for HOCM  Past Medical History:  Diagnosis Date   Apical variant hypertrophic cardiomyopathy (HCC)    Elevated troponin    Hypercholesterolemia    NSVT (nonsustained ventricular tachycardia) (HCC)    Past Surgical History:  Procedure Laterality Date   EP IMPLANTABLE DEVICE N/A 07/15/2016   Procedure: SubQ ICD Implant;  Surgeon: Duke Salvia, MD;  Location: [facility] INVASIVE CV LAB;  Service: Cardiovascular;  Laterality: N/A;   KNEE SURGERY      Current Outpatient Medications  Medication Sig Dispense Refill   apixaban (ELIQUIS) 5 MG TABS tablet Take 1 tablet (5 mg total) by mouth 2 (two) times daily. 180 tablet 3   atorvastatin (LIPITOR) 20 MG tablet TAKE 1 TABLET BY MOUTH ONCE DAILY 90 tablet 3   diphenhydrAMINE (BENADRYL) 25 MG tablet Take 25-50 mg by mouth at bedtime as needed for allergies or sleep.     loratadine (CLARITIN) 10 MG tablet Take 10 mg by mouth 3 (three) times a week.     naproxen sodium (ALEVE) 220 MG tablet Take 220-410 mg by mouth daily as needed (pain).     oxymetazoline  (AFRIN) 0.05 % nasal spray Place 1 spray into both nostrils as needed. Three-five times a week at night     psyllium (METAMUCIL) 58.6 % powder Take 1 packet by mouth as needed.     No current facility-administered medications for this visit.    Allergies:   Patient has no known allergies.   Social History: Social History   Socioeconomic History   Marital status: Married    Spouse name: Not on file   Number of children: Not on file   Years of education: Not on file   Highest education level: Not on file  Occupational History   Not on file  Tobacco Use   Smoking status: Never   Smokeless tobacco: Never  Substance and Sexual Activity   Alcohol use: Yes    Alcohol/week: 1.0 - 2.0 standard drink    Types: 1 - 2 Standard drinks or equivalent per week    Comment: occasional   Drug use: No   Sexual activity: Not on file  Other Topics Concern   Not on file  Social History Narrative   Not on file   Social Determinants of Health   Financial Resource Strain: Not on file  Food Insecurity: Not on file  Transportation Needs: Not on file  Physical Activity: Not on file  Stress: Not on file  Social Connections: Not on file  Intimate Partner Violence: Not on file    Family History: Family  History  Problem Relation Age of Onset   Heart disease Father        HCM-IHSS   Diabetes Father    Hypertension Father    Hypertension Mother    CAD Mother    Hypertension Sister     Review of Systems: All other systems reviewed and are otherwise negative except as noted above.   Physical Exam: Vitals:   06/09/21 0823  BP: 130/82  Pulse: (!) 56  SpO2: 97%  Weight: 171 lb (77.6 kg)  Height: 5' 9.5" (1.765 m)     GEN- The patient is well appearing, alert and oriented x 3 today.   HEENT: normocephalic, atraumatic; sclera clear, conjunctiva pink; hearing intact; oropharynx clear; neck supple, no JVP Lymph- no cervical lymphadenopathy Lungs- Clear to ausculation bilaterally, normal  work of breathing.  No wheezes, rales, rhonchi Heart- Regular rate and rhythm, no murmurs, rubs or gallops, PMI not laterally displaced GI- soft, non-tender, non-distended, bowel sounds present, no hepatosplenomegaly Extremities- no clubbing or cyanosis. No edema; DP/PT/radial pulses 2+ bilaterally MS- no significant deformity or atrophy Skin- warm and dry, no rash or lesion; ICD pocket well healed Psych- euthymic mood, full affect Neuro- strength and sensation are intact  ICD interrogation- reviewed in detail today,  See PACEART report  EKG:  EKG is not ordered today. Lourena Simmonds shows NSR  Recent Labs: 05/07/2021: BUN 8; Creatinine, Ser 1.34; Hemoglobin 15.0; Platelets 243; Potassium 3.4; Sodium 139   Wt Readings from Last 3 Encounters:  06/09/21 171 lb (77.6 kg)  05/07/21 175 lb (79.4 kg)  10/27/16 156 lb 3.2 oz (70.9 kg)     Other studies Reviewed: Additional studies/ records that were reviewed today include: Previous EP office notes.   Assessment and Plan:  1.  HOCM s/p [employer] S-ICD  euvolemic today Stable on an appropriate medical regimen Normal ICD function See Pace Art report No changes today  2. Paroxysmal atrial fibrillation NSR by Watch and Kardia. Patient declined EKG with multiple recently. Continue Eliquis for CHA2DS2-VASc of at least 1 with HF  3. NSVT Stable  Current medicines are reviewed at length with the patient today.   The patient does not have concerns regarding his medicines.  The following changes were made today:  none  Labs/ tests ordered today include:  Orders Placed This Encounter  Procedures   Basic metabolic panel   EKG 12-Lead    Disposition:   Follow up with Dr. Graciela Husbands  6 months. Sooner with more AF. Have messaged [employer] about troubleshooting his monitor.    Dustin Flock, PA-C  06/09/2021 9:28 AM  [facility] [address] [place] [address] [phone]  (office) [phone] (fax)

## 2021-06-09 ENCOUNTER — Ambulatory Visit (INDEPENDENT_AMBULATORY_CARE_PROVIDER_SITE_OTHER): Payer: No Typology Code available for payment source | Admitting: Student

## 2021-06-09 ENCOUNTER — Encounter: Payer: Self-pay | Admitting: Student

## 2021-06-09 ENCOUNTER — Other Ambulatory Visit: Payer: Self-pay

## 2021-06-09 VITALS — BP 130/82 | HR 56 | Ht 69.5 in | Wt 171.0 lb

## 2021-06-09 DIAGNOSIS — I422 Other hypertrophic cardiomyopathy: Secondary | ICD-10-CM | POA: Diagnosis not present

## 2021-06-09 DIAGNOSIS — I4891 Unspecified atrial fibrillation: Secondary | ICD-10-CM

## 2021-06-09 DIAGNOSIS — I472 Ventricular tachycardia: Secondary | ICD-10-CM

## 2021-06-09 DIAGNOSIS — I4729 Other ventricular tachycardia: Secondary | ICD-10-CM

## 2021-06-09 LAB — BASIC METABOLIC PANEL
BUN/Creatinine Ratio: 11 (ref 9–20)
BUN: 14 mg/dL (ref 6–24)
CO2: 21 mmol/L (ref 20–29)
Calcium: 9.3 mg/dL (ref 8.7–10.2)
Chloride: 105 mmol/L (ref 96–106)
Creatinine, Ser: 1.23 mg/dL (ref 0.76–1.27)
Glucose: 94 mg/dL (ref 65–99)
Potassium: 4.2 mmol/L (ref 3.5–5.2)
Sodium: 141 mmol/L (ref 134–144)
eGFR: 71 mL/min/{1.73_m2} (ref >59)

## 2021-06-09 LAB — CUP PACEART INCLINIC DEVICE CHECK
Date Time Interrogation Session: 20220822092736
Implantable Lead Implant Date: 20170927
Implantable Lead Location: 753862
Implantable Lead Model: 3401
Implantable Pulse Generator Implant Date: 20170927
Pulse Gen Serial Number: 213564

## 2021-06-09 NOTE — Patient Instructions (Addendum)
Medication Instructions:  Your physician recommends that you continue on your current medications as directed. Please refer to the Current Medication list given to you today.  *If you need a refill on your cardiac medications before your next appointment, please call your pharmacy*   Lab Work: TODAY: BMET  If you have labs (blood work) drawn today and your tests are completely normal, you will receive your results only by: MyChart Message (if you have MyChart) OR A paper copy in the mail If you have any lab test that is abnormal or we need to change your treatment, we will call you to review the results.   Follow-Up: At [facility], you and your health needs are our priority.  As part of our continuing mission to provide you with exceptional heart care, we have created designated Provider Care Teams.  These Care Teams include your primary Cardiologist (physician) and Advanced Practice Providers (APPs -  Physician Assistants and Nurse Practitioners) who all work together to provide you with the care you need, when you need it.   Your next appointment:   6 month(s)  The format for your next appointment:   In Person  Provider:   You may see Sherryl Manges, MD or one of the following Advanced Practice Providers on your designated Care Team:   Francis Dowse, [place] Casimiro Needle "Mardelle Matte" Natasha Bence   [facility] [employer] tech support at [phone]

## 2021-06-10 LAB — CUP PACEART REMOTE DEVICE CHECK
Battery Remaining Percentage: 46 %
Date Time Interrogation Session: 20220808123500
Implantable Lead Implant Date: 20170927
Implantable Lead Location: 753862
Implantable Lead Model: 3401
Implantable Pulse Generator Implant Date: 20170927
Pulse Gen Serial Number: 213564

## 2021-06-12 NOTE — Progress Notes (Signed)
Remote ICD transmission.   

## 2021-06-24 ENCOUNTER — Other Ambulatory Visit (HOSPITAL_COMMUNITY): Payer: Self-pay

## 2021-07-14 MED FILL — Atorvastatin Calcium Tab 20 MG (Base Equivalent): ORAL | 90 days supply | Qty: 90 | Fill #1 | Status: AC

## 2021-07-15 ENCOUNTER — Other Ambulatory Visit (HOSPITAL_COMMUNITY): Payer: Self-pay

## 2021-08-11 ENCOUNTER — Other Ambulatory Visit (HOSPITAL_COMMUNITY): Payer: Self-pay

## 2021-08-11 MED ORDER — ATORVASTATIN CALCIUM 20 MG PO TABS
20.0000 mg | ORAL_TABLET | Freq: Every day | ORAL | 4 refills | Status: DC
  Filled 2021-08-11 – 2021-10-10 (×3): qty 90, 90d supply, fill #0
  Filled 2022-01-19: qty 90, 90d supply, fill #1
  Filled 2022-04-20: qty 90, 90d supply, fill #2
  Filled 2022-07-06: qty 25, 25d supply, fill #3
  Filled 2022-07-06: qty 65, 65d supply, fill #3

## 2021-08-14 ENCOUNTER — Encounter: Payer: No Typology Code available for payment source | Admitting: Internal Medicine

## 2021-08-22 ENCOUNTER — Ambulatory Visit (INDEPENDENT_AMBULATORY_CARE_PROVIDER_SITE_OTHER): Payer: No Typology Code available for payment source

## 2021-08-22 DIAGNOSIS — I422 Other hypertrophic cardiomyopathy: Secondary | ICD-10-CM

## 2021-08-25 LAB — CUP PACEART REMOTE DEVICE CHECK
Battery Remaining Percentage: 43 %
Date Time Interrogation Session: 20221106151300
Implantable Lead Implant Date: 20170927
Implantable Lead Location: 753862
Implantable Lead Model: 3401
Implantable Pulse Generator Implant Date: 20170927
Pulse Gen Serial Number: 213564

## 2021-08-27 NOTE — Progress Notes (Signed)
Remote ICD transmission.   

## 2021-09-18 ENCOUNTER — Telehealth: Payer: Self-pay

## 2021-09-18 ENCOUNTER — Other Ambulatory Visit (HOSPITAL_BASED_OUTPATIENT_CLINIC_OR_DEPARTMENT_OTHER): Payer: Self-pay

## 2021-09-18 NOTE — Telephone Encounter (Signed)
Alert remote reviewed.    Possible Device malfunction (Fault Code BD)  Sent to triage high alert. Next remote 02/03/202.   Consulted with Joey from [employer] and states he will call into tech services. Will follow up per Joey.

## 2021-09-19 ENCOUNTER — Other Ambulatory Visit: Payer: Self-pay

## 2021-09-19 ENCOUNTER — Ambulatory Visit: Payer: No Typology Code available for payment source | Attending: Internal Medicine

## 2021-09-19 DIAGNOSIS — Z23 Encounter for immunization: Secondary | ICD-10-CM

## 2021-09-19 NOTE — Progress Notes (Signed)
   Covid-19 Vaccination Clinic  Name:  Bradley SWIDER, MD    MRN: [id number] DOB: [birth year]  09/19/2021  [name] was observed post Covid-19 immunization for 15 minutes without incident. He was provided with Vaccine Information Sheet and instruction to access the V-Safe system.   [name] was instructed to call 911 with any severe reactions post vaccine: Difficulty breathing  Swelling of face and throat  A fast heartbeat  A bad rash all over body  Dizziness and weakness   Immunizations Administered     Name Date Dose VIS Date Route   Pfizer Covid-19 Vaccine Bivalent Booster 09/19/2021  3:14 PM 0.3 mL 06/18/2021 Intramuscular   Manufacturer: [employer], [employer]   Lot: [id number]   NDC: [phone]

## 2021-09-22 NOTE — Telephone Encounter (Signed)
My Chart message sent to patient, Dr, Graciela Husbands made aware and has contacted patient directly.

## 2021-10-02 ENCOUNTER — Other Ambulatory Visit (HOSPITAL_COMMUNITY): Payer: Self-pay

## 2021-10-09 ENCOUNTER — Other Ambulatory Visit (HOSPITAL_COMMUNITY): Payer: Self-pay

## 2021-10-10 ENCOUNTER — Other Ambulatory Visit (HOSPITAL_BASED_OUTPATIENT_CLINIC_OR_DEPARTMENT_OTHER): Payer: Self-pay

## 2021-10-10 ENCOUNTER — Other Ambulatory Visit (HOSPITAL_COMMUNITY): Payer: Self-pay

## 2021-10-30 ENCOUNTER — Other Ambulatory Visit: Payer: Self-pay

## 2021-10-30 ENCOUNTER — Encounter: Payer: Self-pay | Admitting: Internal Medicine

## 2021-10-30 ENCOUNTER — Ambulatory Visit (INDEPENDENT_AMBULATORY_CARE_PROVIDER_SITE_OTHER): Payer: No Typology Code available for payment source | Admitting: Internal Medicine

## 2021-10-30 VITALS — BP 126/82 | HR 62 | Ht 69.5 in | Wt 169.8 lb

## 2021-10-30 DIAGNOSIS — I4729 Other ventricular tachycardia: Secondary | ICD-10-CM

## 2021-10-30 DIAGNOSIS — I422 Other hypertrophic cardiomyopathy: Secondary | ICD-10-CM | POA: Diagnosis not present

## 2021-10-30 DIAGNOSIS — I4891 Unspecified atrial fibrillation: Secondary | ICD-10-CM | POA: Diagnosis not present

## 2021-10-30 DIAGNOSIS — Z01812 Encounter for preprocedural laboratory examination: Secondary | ICD-10-CM

## 2021-10-30 DIAGNOSIS — Z01818 Encounter for other preprocedural examination: Secondary | ICD-10-CM

## 2021-10-30 DIAGNOSIS — Z95810 Presence of automatic (implantable) cardiac defibrillator: Secondary | ICD-10-CM

## 2021-10-30 NOTE — Progress Notes (Signed)
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  He has a history of nonsustained ventricular tachycardia.  He had gadolinium enhancement diffusely in the septum and apex and the anterior and inferior walls but without evidence of apical ballooning. He is status post S ICD implantation 9/17  Has had intercurrent afib, most recently 1/23 duration about 17 hrs.  anticoagulation with Apixoban   associated with shortness of breath dyspnea on exertion and some exertional lightheadedness;  heart rate was about 115  The patient denies chest pain, shortness of breath, nocturnal dyspnea, orthopnea or peripheral edema.  There have been no palpitations, lightheadedness or syncope.      Records and Results Reviewed Consultation notes from Dr. 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 180 tablet 3   atorvastatin (LIPITOR) 20 MG tablet Take 1 tablet (20 mg total) by mouth daily. 90 tablet 4   diphenhydrAMINE (BENADRYL) 25 MG tablet Take 25-50 mg by mouth at bedtime as needed for allergies or sleep.     Fluticasone Propionate  (FLONASE NA) Place 1-2 sprays into both nostrils once a week.     loratadine (CLARITIN) 10 MG tablet Take 10 mg by mouth 3 (three) times a week.     naproxen sodium (ALEVE) 220 MG tablet Take 220-410 mg by mouth daily as needed (pain).     oxymetazoline (AFRIN) 0.05 % nasal spray Place 1 spray into both nostrils as needed. Three-five times a week at night     psyllium (METAMUCIL) 58.6 % powder Take 1 packet by mouth as needed.     No current facility-administered medications for this visit.    No Known Allergies    Review of Systems negative except from HPI and PMH  Physical Exam BP 126/82    Pulse 62    Ht 5' 9.5" (1.765 m)    Wt 169 lb 12.8 oz (77 kg)    SpO2 97%    BMI 24.72 kg/m  Well developed and well nourished in no acute distress HENT normal Neck supple with JVP-flat Clear Device pocket well healed; without hematoma or erythema.  There is no tethering  Regular rate and rhythm, no  gallop No / murmur Abd-soft with active BS No Clubbing cyanosis  edema Skin-warm and dry A & Oriented  Grossly normal sensory and motor function  ECG sinus at 62 Intervals 15/09/41 ST-T changes leads II, 3, F, V4-V6 Prominent Q waves lead V1 and V2   Assessment and  Plan  Apical HCM MHY7 + gene   VT-NS  +  GAD  SICD  Atrial fibrillation   Elevated blood pressure    Blood pressure reasonably controlled.  Recurrent atrial fibrillation with a rapid rate.  Converted spontaneously.  We will give him a prescription for metoprolol tartrate to use as needed.  His device is at battery depletion advisory alert.  We have discussed generator replacement including the risks predominantly infection.  He understands and is willing to proceed.  We also discussed atrial fibrillation ablation as the next strategy given his recurrent events.  He will look into insurance coverage for various locations.

## 2021-10-30 NOTE — Patient Instructions (Addendum)
Medication Instructions:  Your physician recommends that you continue on your current medications as directed. Please refer to the Current Medication list given to you today.  *If you need a refill on your cardiac medications before your next appointment, please call your pharmacy*   Lab Work: None ordered If you have labs (blood work) drawn today and your tests are completely normal, you will receive your results only by: [place] (if you have MyChart) OR A paper copy in the mail If you have any lab test that is abnormal or we need to change your treatment, we will call you to review the results.   Testing/Procedures: None ordered   Follow-Up: At [facility], you and your health needs are our priority.  As part of our continuing mission to provide you with exceptional heart care, we have created designated Provider Care Teams.  These Care Teams include your primary Cardiologist (physician) and Advanced Practice Providers (APPs -  Physician Assistants and Nurse Practitioners) who all work together to provide you with the care you need, when you need it.  Remote monitoring is used to monitor your Pacemaker or ICD from home. This monitoring reduces the number of office visits required to check your device to one time per year. It allows [place] to keep an eye on the functioning of your device to ensure it is working properly. You are scheduled for a device check from home on 11/21/2021. You may send your transmission at any time that day. If you have a wireless device, the transmission will be sent automatically. After your physician reviews your transmission, you will receive a postcard with your next transmission date.  Your next appointment:   2 week(s) after your defibrillator battery change  The format for your next appointment:   In Person  Provider:   Device clinic for a wound check    Your physician recommends that you schedule a follow-up appointment in: 3 months after  your battery change.   Thank you for choosing [facility]!!   [phone]    Other Instructions   Implantable Device Instructions  You are scheduled for: battery change on 11/17/2021 with Dr. Curt Bears.  1.   Pre procedure testing-             A.  LAB WORK--- On 10/30/2021  for your pre procedure blood work.  You do NOT need to be fasting.   On the day of your procedure 11/17/2021 you will go to [facility] [phone] [address]) at 5:30 am.  Dennis Bast will go to the main entrance A [employer]) and enter where the [employer] are.  You will check in at ADMITTING.  You may have one support person come in to the hospital with you.  They will be asked to wait in the waiting room.   3.   Do not eat or drink after midnight prior to your procedure.   4.   On the morning of your procedure do NOT take any medication.  5.  The night before your procedure and the morning of your procedure scrub your neck/chest with surgical scrub.  See instruction letter below.   5.  Plan for an overnight stay, but you may be discharged home after your procedure. If you use your phone frequently bring your phone charger, in case you have to stay.  If you are discharged after your procedure you will need someone to drive you home and be with your for 24 hours  after your procedure.   6.  You will follow up with the [place] clinic 10-14 days after your procedure. You will follow up with Dr. Caryl Comes 91 days after your procedure.  These appointments will be made for you.  7. FYI: For your safety, and to allow [place] to monitor your vital signs accurately during the surgery/procedure we request that if you have artificial nails, gel coating, SNS etc. Please have those removed prior to your surgery/procedure. Not having the nail coverings /polish removed may result in cancellation or delay of your surgery/procedure.   * If you have ANY questions after you get home, please call the office [phone]  and ask for [facility] or send a MyChart message.     [place] - Preparing For Surgery  Before surgery, you can play an important role. Because skin is not sterile, your skin needs to be as free of germs as possible. You can reduce the number of germs on your skin by washing with CHG (chlorahexidine gluconate) Soap before surgery.  CHG is an antiseptic cleaner which kills germs and bonds with the skin to continue killing germs even after washing.   Please do not use if you have an allergy to CHG or antibacterial soaps.  If your skin becomes reddened/irritated stop using the CHG.   Do not shave (including legs and underarms) for at least 48 hours prior to first CHG shower.  It is OK to shave your face.  Please follow these instructions carefully:  1.  Shower the night before surgery and the morning of surgery with CHG.  2.  If you choose to wash your hair, wash your hair first as usual with your normal shampoo.  3.  After you shampoo, rinse your hair and body thoroughly to remove the shampoo.  4.  Use CHG as you would any other liquid soap.  You can apply CHG directly to the skin and wash gently with a clean washcloth. 5.  Apply the CHG Soap to your body ONLY FROM THE NECK DOWN.  Do not use on open wounds or open sores.  Avoid contact with your eyes, ears, mouth and genitals (private parts).  Wash genitals (private parts) with your normal soap.  6.  Wash thoroughly, paying special attention to the area where your surgery will be performed.  7.  Thoroughly rinse your body with warm water from the neck down.   8.  DO NOT shower/wash with your normal soap after using and rinsing off the CHG soap.  9.  Pat yourself dry with a clean towel.           10.  Wear clean pajamas.           11.  Place clean sheets on your bed the night of your first shower and do not sleep with pets.  Day of Surgery: Do not apply any deodorants/lotions.  Please wear clean clothes to the hospital/surgery center.

## 2021-10-30 NOTE — H&P (View-Only) (Signed)
Patient Care Team: Birdie Sons, Bradley Castaneda (Inactive) as PCP - General (Internal Medicine) Deboraha Sprang, Bradley Castaneda as PCP - Electrophysiology (Cardiology)   HPI  Bradley Dowse, Bradley Castaneda is a 51 y.o. male seen in follow-up for apical H CM without evidence of outflow tract obstruction.  He has a history of nonsustained ventricular tachycardia.  He had gadolinium enhancement diffusely in the septum and apex and the anterior and inferior walls but without evidence of apical ballooning. He is status post S ICD implantation 9/17  Has had intercurrent afib, most recently 1/23 duration about 17 hrs.  anticoagulation with Apixoban   associated with shortness of breath dyspnea on exertion and some exertional lightheadedness;  heart rate was about 115  The patient denies chest pain, shortness of breath, nocturnal dyspnea, orthopnea or peripheral edema.  There have been no palpitations, lightheadedness or syncope.      Records and Results Reviewed Consultation notes from Dr. 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      Past Medical History:  Diagnosis Date   Apical variant hypertrophic cardiomyopathy ([place])    Elevated troponin    Hypercholesterolemia    NSVT (nonsustained ventricular tachycardia)     Past Surgical History:  Procedure Laterality Date   EP IMPLANTABLE DEVICE N/A 07/15/2016   Procedure: SubQ ICD Implant;  Surgeon: Deboraha Sprang, Bradley Castaneda;  Location: [place] CV LAB;  Service: Cardiovascular;  Laterality: N/A;   KNEE SURGERY      Current Outpatient Medications  Medication Sig Dispense Refill   apixaban (ELIQUIS) 5 MG TABS tablet Take 1 tablet (5 mg total) by mouth 2 (two) times daily. 180 tablet 3   atorvastatin (LIPITOR) 20 MG tablet Take 1 tablet (20 mg total) by mouth daily. 90 tablet 4   diphenhydrAMINE (BENADRYL) 25 MG tablet Take 25-50 mg by mouth at bedtime as needed for allergies or sleep.     Fluticasone Propionate  (FLONASE NA) Place 1-2 sprays into both nostrils once a week.     loratadine (CLARITIN) 10 MG tablet Take 10 mg by mouth 3 (three) times a week.     naproxen sodium (ALEVE) 220 MG tablet Take 220-410 mg by mouth daily as needed (pain).     oxymetazoline (AFRIN) 0.05 % nasal spray Place 1 spray into both nostrils as needed. Three-five times a week at night     psyllium (METAMUCIL) 58.6 % powder Take 1 packet by mouth as needed.     No current facility-administered medications for this visit.    No Known Allergies    Review of Systems negative except from HPI and PMH  Physical Exam BP 126/82    Pulse 62    Ht 5' 9.5" (1.765 m)    Wt 169 lb 12.8 oz (77 kg)    SpO2 97%    BMI 24.72 kg/m  Well developed and well nourished in no acute distress HENT normal Neck supple with JVP-flat Clear Device pocket well healed; without hematoma or erythema.  There is no tethering  Regular rate and rhythm, no  gallop No / murmur Abd-soft with active BS No Clubbing cyanosis  edema Skin-warm and dry A & Oriented  Grossly normal sensory and motor function  ECG sinus at 62 Intervals 15/09/41 ST-T changes leads II, 3, F, V4-V6 Prominent Q waves lead V1 and V2   Assessment and  Plan  Apical HCM MHY7 + gene   VT-NS  +  GAD  SICD  Atrial fibrillation   Elevated blood pressure    Blood pressure reasonably controlled.  Recurrent atrial fibrillation with a rapid rate.  Converted spontaneously.  We will give him a prescription for metoprolol tartrate to use as needed.  His device is at battery depletion advisory alert.  We have discussed generator replacement including the risks predominantly infection.  He understands and is willing to proceed.  We also discussed atrial fibrillation ablation as the next strategy given his recurrent events.  He will look into insurance coverage for various locations.

## 2021-10-31 LAB — CBC
Hematocrit: 43.9 % (ref 37.5–51.0)
Hemoglobin: 14.7 g/dL (ref 13.0–17.7)
MCH: 29.8 pg (ref 26.6–33.0)
MCHC: 33.5 g/dL (ref 31.5–35.7)
MCV: 89 fL (ref 79–97)
Platelets: 242 10*3/uL (ref 150–450)
RBC: 4.94 x10E6/uL (ref 4.14–5.80)
RDW: 12.6 % (ref 11.6–15.4)
WBC: 4.8 10*3/uL (ref 3.4–10.8)

## 2021-10-31 LAB — BASIC METABOLIC PANEL
BUN/Creatinine Ratio: 12 (ref 9–20)
BUN: 15 mg/dL (ref 6–24)
CO2: 26 mmol/L (ref 20–29)
Calcium: 9.4 mg/dL (ref 8.7–10.2)
Chloride: 107 mmol/L — ABNORMAL HIGH (ref 96–106)
Creatinine, Ser: 1.23 mg/dL (ref 0.76–1.27)
Glucose: 91 mg/dL (ref 70–99)
Potassium: 4.6 mmol/L (ref 3.5–5.2)
Sodium: 144 mmol/L (ref 134–144)
eGFR: 71 mL/min/{1.73_m2} (ref >59)

## 2021-11-04 ENCOUNTER — Encounter: Payer: Self-pay | Admitting: Internal Medicine

## 2021-11-04 ENCOUNTER — Other Ambulatory Visit (HOSPITAL_COMMUNITY): Payer: Self-pay

## 2021-11-04 MED ORDER — METOPROLOL TARTRATE 25 MG PO TABS
25.0000 mg | ORAL_TABLET | Freq: Two times a day (BID) | ORAL | 1 refills | Status: DC | PRN
  Filled 2021-11-04: qty 30, 15d supply, fill #0

## 2021-11-04 NOTE — Telephone Encounter (Signed)
Pt aware Rx sent in, apologized for the delay. Pt appreciates my  help with this.

## 2021-11-14 NOTE — Pre-Procedure Instructions (Signed)
Instructed patient on the following items: Arrival time 0530 Nothing to eat or drink after midnight No meds AM of procedure Responsible person to drive you home and stay with you for 24 hrs Wash with special soap night before and morning of procedure If on anti-coagulant drug instructions Eliquis- last dose Saturday Morning

## 2021-11-16 NOTE — Anesthesia Preprocedure Evaluation (Addendum)
Anesthesia Evaluation  Patient identified by MRN, date of birth, ID band Patient awake    Reviewed: Allergy & Precautions, NPO status , Patient's Chart, lab work & pertinent test results  Airway Mallampati: II  TM Distance: >3 FB Neck ROM: Full    Dental no notable dental hx.    Pulmonary neg pulmonary ROS,    Pulmonary exam normal        Cardiovascular hypertension, Pt. on medications and Pt. on home beta blockers +CHF  + dysrhythmias (on Eliquis) Ventricular Tachycardia + Cardiac Defibrillator  Rhythm:Regular Rate:Normal     Neuro/Psych negative neurological ROS  negative psych ROS   GI/Hepatic negative GI ROS, Neg liver ROS,   Endo/Other  negative endocrine ROS  Renal/GU negative Renal ROS  negative genitourinary   Musculoskeletal negative musculoskeletal ROS (+)   Abdominal Normal abdominal exam  (+)   Peds  Hematology   Anesthesia Other Findings   Reproductive/Obstetrics                            Anesthesia Physical Anesthesia Plan  ASA: 3  Anesthesia Plan: General   Post-op Pain Management:    Induction: Intravenous  PONV Risk Score and Plan: 2 and Ondansetron, Dexamethasone, Midazolam and Treatment may vary due to age or medical condition  Airway Management Planned: Mask and Oral ETT  Additional Equipment: None  Intra-op Plan:   Post-operative Plan: Extubation in OR  Informed Consent: I have reviewed the patients History and Physical, chart, labs and discussed the procedure including the risks, benefits and alternatives for the proposed anesthesia with the patient or authorized representative who has indicated his/her understanding and acceptance.     Dental advisory given  Plan Discussed with: CRNA  Anesthesia Plan Comments: (Lab Results      Component                Value               Date                      WBC                      4.8                  10/30/2021                HGB                      14.7                10/30/2021                HCT                      43.9                10/30/2021                MCV                      89                  10/30/2021                PLT  242                 10/30/2021           Lab Results      Component                Value               Date                      NA                       144                 10/30/2021                K                        4.6                 10/30/2021                CO2                      26                  10/30/2021                GLUCOSE                  91                  10/30/2021                BUN                      15                  10/30/2021                CREATININE               1.23                10/30/2021                CALCIUM                  9.4                 10/30/2021                EGFR                     71                  10/30/2021                GFRNONAA                 >60                 05/07/2021           ECHO 07/22: 1. Left ventricular ejection fraction, by estimation, is 60 to 65%. The  left ventricle has normal function. The left ventricle has no regional  wall motion abnormalities. There is mild concentric left ventricular  hypertrophy. Left ventricular diastolic  parameters are consistent with Grade I diastolic dysfunction (impaired  relaxation).  2. Right ventricular systolic function is normal. The right ventricular  size is normal. There is normal pulmonary artery systolic pressure. The  estimated right ventricular systolic pressure is 45.1 mmHg.  3. The mitral valve is normal in structure. Trivial mitral valve  regurgitation. No evidence of mitral stenosis.  4. The aortic valve is tricuspid. Aortic valve regurgitation is not  visualized. Mild aortic valve sclerosis is present, with no evidence of  aortic valve stenosis.  5. The inferior vena cava is normal in size  with greater than 50%  respiratory variability, suggesting right atrial pressure of 3 mmHg.)       Anesthesia Quick Evaluation

## 2021-11-17 ENCOUNTER — Ambulatory Visit (HOSPITAL_COMMUNITY)
Admission: RE | Admit: 2021-11-17 | Discharge: 2021-11-17 | Disposition: A | Discharge status: Home / Self Care | Payer: No Typology Code available for payment source | Attending: Internal Medicine | Admitting: Internal Medicine

## 2021-11-17 ENCOUNTER — Other Ambulatory Visit: Payer: Self-pay

## 2021-11-17 ENCOUNTER — Ambulatory Visit (HOSPITAL_COMMUNITY): Payer: No Typology Code available for payment source | Admitting: Critical Care Medicine

## 2021-11-17 ENCOUNTER — Encounter (HOSPITAL_COMMUNITY)
Admission: RE | Disposition: A | Discharge status: Home / Self Care | Payer: No Typology Code available for payment source | Source: Home / Self Care | Attending: Internal Medicine

## 2021-11-17 DIAGNOSIS — I4891 Unspecified atrial fibrillation: Secondary | ICD-10-CM | POA: Diagnosis not present

## 2021-11-17 DIAGNOSIS — I422 Other hypertrophic cardiomyopathy: Secondary | ICD-10-CM | POA: Diagnosis not present

## 2021-11-17 DIAGNOSIS — Z4502 Encounter for adjustment and management of automatic implantable cardiac defibrillator: Secondary | ICD-10-CM | POA: Insufficient documentation

## 2021-11-17 DIAGNOSIS — I472 Ventricular tachycardia, unspecified: Secondary | ICD-10-CM | POA: Insufficient documentation

## 2021-11-17 DIAGNOSIS — R030 Elevated blood-pressure reading, without diagnosis of hypertension: Secondary | ICD-10-CM | POA: Insufficient documentation

## 2021-11-17 DIAGNOSIS — F411 Generalized anxiety disorder: Secondary | ICD-10-CM | POA: Diagnosis not present

## 2021-11-17 DIAGNOSIS — Z7901 Long term (current) use of anticoagulants: Secondary | ICD-10-CM | POA: Diagnosis not present

## 2021-11-17 DIAGNOSIS — I421 Obstructive hypertrophic cardiomyopathy: Secondary | ICD-10-CM | POA: Diagnosis not present

## 2021-11-17 DIAGNOSIS — Z95810 Presence of automatic (implantable) cardiac defibrillator: Secondary | ICD-10-CM

## 2021-11-17 HISTORY — PX: SUBQ ICD CHANGEOUT: EP1235

## 2021-11-17 SURGERY — SUBQ ICD CHANGEOUT
Anesthesia: General

## 2021-11-17 MED ORDER — SUGAMMADEX SODIUM 200 MG/2ML IV SOLN
INTRAVENOUS | Status: DC | PRN
  Administered 2021-11-17: 150 mg via INTRAVENOUS

## 2021-11-17 MED ORDER — LIDOCAINE 2% (20 MG/ML) 5 ML SYRINGE
INTRAMUSCULAR | Status: DC | PRN
  Administered 2021-11-17: 60 mg via INTRAVENOUS

## 2021-11-17 MED ORDER — CEFAZOLIN SODIUM-DEXTROSE 2-4 GM/100ML-% IV SOLN
INTRAVENOUS | Status: AC
  Filled 2021-11-17: qty 100

## 2021-11-17 MED ORDER — HEPARIN (PORCINE) IN NACL 1000-0.9 UT/500ML-% IV SOLN
INTRAVENOUS | Status: AC
  Filled 2021-11-17: qty 500

## 2021-11-17 MED ORDER — FENTANYL CITRATE (PF) 250 MCG/5ML IJ SOLN
INTRAMUSCULAR | Status: DC | PRN
  Administered 2021-11-17: 100 ug via INTRAVENOUS

## 2021-11-17 MED ORDER — ONDANSETRON HCL 4 MG/2ML IJ SOLN
INTRAMUSCULAR | Status: DC | PRN
Start: 2021-11-17 — End: 2021-11-17
  Administered 2021-11-17: 4 mg via INTRAVENOUS

## 2021-11-17 MED ORDER — PROPOFOL 10 MG/ML IV BOLUS
INTRAVENOUS | Status: DC | PRN
  Administered 2021-11-17: 170 mg via INTRAVENOUS

## 2021-11-17 MED ORDER — SODIUM CHLORIDE 0.9 % IV SOLN: INTRAVENOUS | Status: DC

## 2021-11-17 MED ORDER — BUPIVACAINE HCL (PF) 0.25 % IJ SOLN
INTRAMUSCULAR | Status: DC | PRN
  Administered 2021-11-17: 60 mL

## 2021-11-17 MED ORDER — DEXAMETHASONE SODIUM PHOSPHATE 10 MG/ML IJ SOLN
INTRAMUSCULAR | Status: DC | PRN
  Administered 2021-11-17: 4 mg via INTRAVENOUS

## 2021-11-17 MED ORDER — SODIUM CHLORIDE 0.9 % IV SOLN
INTRAVENOUS | Status: AC
  Filled 2021-11-17: qty 2

## 2021-11-17 MED ORDER — GENTAMICIN SULFATE 40 MG/ML IJ SOLN
80.0000 mg | INTRAMUSCULAR | Status: AC
  Administered 2021-11-17: 80 mg

## 2021-11-17 MED ORDER — PHENYLEPHRINE HCL-NACL 20-0.9 MG/250ML-% IV SOLN
INTRAVENOUS | Status: DC | PRN
  Administered 2021-11-17: 10 ug/min via INTRAVENOUS

## 2021-11-17 MED ORDER — BUPIVACAINE HCL (PF) 0.25 % IJ SOLN
INTRAMUSCULAR | Status: AC
  Filled 2021-11-17: qty 30

## 2021-11-17 MED ORDER — ACETAMINOPHEN 325 MG PO TABS
325.0000 mg | ORAL_TABLET | ORAL | Status: DC | PRN
  Filled 2021-11-17: qty 2

## 2021-11-17 MED ORDER — LIDOCAINE HCL (PF) 1 % IJ SOLN: INTRAMUSCULAR | Status: DC | PRN

## 2021-11-17 MED ORDER — MIDAZOLAM HCL 5 MG/5ML IJ SOLN
INTRAMUSCULAR | Status: DC | PRN
  Administered 2021-11-17: 2 mg via INTRAVENOUS

## 2021-11-17 MED ORDER — SODIUM CHLORIDE 0.9 % IV SOLN: INTRAVENOUS | Status: AC

## 2021-11-17 MED ORDER — ROCURONIUM BROMIDE 10 MG/ML (PF) SYRINGE
PREFILLED_SYRINGE | INTRAVENOUS | Status: DC | PRN
  Administered 2021-11-17: 60 mg via INTRAVENOUS

## 2021-11-17 MED ORDER — CEFAZOLIN SODIUM-DEXTROSE 2-4 GM/100ML-% IV SOLN
2.0000 g | INTRAVENOUS | Status: AC
  Administered 2021-11-17: 2 g via INTRAVENOUS

## 2021-11-17 SURGICAL SUPPLY — 4 items
CABLE SURGICAL S-101-97-12 (CABLE) ×3 IMPLANT
ICD SUBCU MRI EMBLEM A219 (ICD Generator) ×1 IMPLANT
PAD DEFIB RADIO PHYSIO CONN (PAD) ×3 IMPLANT
TRAY PACEMAKER INSERTION (PACKS) ×3 IMPLANT

## 2021-11-17 NOTE — Interval H&P Note (Signed)
History and Physical Interval Note:  11/17/2021 7:30 AM  Francis Dowse, MD  has presented today for surgery, with the diagnosis of VT.  The various methods of treatment have been discussed with the patient and family. After consideration of risks, benefits and other options for treatment, the patient has consented to  Procedure(s): SUBQ ICD CHANGEOUT (N/A) as a surgical intervention.  The patient's history has been reviewed, patient examined, no change in status, stable for surgery.  I have reviewed the patient's chart and labs.  Questions were answered to the patient's satisfaction.     Bradley Castaneda

## 2021-11-17 NOTE — Anesthesia Procedure Notes (Signed)
Procedure Name: Intubation Date/Time: 11/17/2021 7:48 AM Performed by: Wilburn Cornelia, CRNA Pre-anesthesia Checklist: Patient identified, Emergency Drugs available, Suction available, Timeout performed and Patient being monitored Patient Re-evaluated:Patient Re-evaluated prior to induction Oxygen Delivery Method: Circle system utilized Preoxygenation: Pre-oxygenation with 100% oxygen Induction Type: IV induction Ventilation: Mask ventilation without difficulty Laryngoscope Size: Mac and 4 Grade View: Grade III Tube type: Oral Tube size: 7.5 mm Number of attempts: 1 Airway Equipment and Method: Stylet Placement Confirmation: ETT inserted through vocal cords under direct vision, positive ETCO2, CO2 detector and breath sounds checked- equal and bilateral Secured at: 23 cm Tube secured with: Tape Dental Injury: Teeth and Oropharynx as per pre-operative assessment

## 2021-11-17 NOTE — Discharge Instructions (Addendum)
Dermabond will come off in next 10-14 days; if not will remove at wound check  °Keep wound dry until tomorrow evening °No Driving x 4 days °Wound check in office as scheduled °Implantable Cardiac Device Battery Change, Care After ° °This sheet gives you information about how to care for yourself after your procedure. Your health care provider may also give you more specific instructions. If you have problems or questions, contact your health care provider. °What can I expect after the procedure? °After your procedure, it is common to have: °· Pain or soreness at the site where the cardiac device was inserted. °· Swelling at the site where the cardiac device was inserted. °· You should received an information card for your new device in 4-8 weeks. °Follow these instructions at home: °Incision care  °· Keep the incision clean and dry. °? Do not take baths, swim, or use a hot tub until after your wound check.  °? Do not shower for at least 7 days, or as directed by your health care provider. °? Pat the area dry with a clean towel. Do not rub the area. This may cause bleeding. °· Follow instructions from your health care provider about how to take care of your incision. Make sure you: °? Leave stitches (sutures), skin glue, or adhesive strips in place. These skin closures may need to stay in place for 2 weeks or longer. If adhesive strip edges start to loosen and curl up, you may trim the loose edges. Do not remove adhesive strips completely unless your health care provider tells you to do that. °· Check your incision area every day for signs of infection. Check for: °? More redness, swelling, or pain. °? More fluid or blood. °? Warmth. °? Pus or a bad smell. °Activity °· Do not lift anything that is heavier than 10 lb (4.5 kg) until your health care provider says it is okay to do so. °· For the first week, or as long as told by your health care provider: °? Avoid lifting your affected arm higher than your  shoulder. °? After 1 week, Be gentle when you move your arms over your head. It is okay to raise your arm to comb your hair. °? Avoid strenuous exercise. °· Ask your health care provider when it is okay to: °? Resume your normal activities. °? Return to work or school. °? Resume sexual activity. °Eating and drinking °· Eat a heart-healthy diet. This should include plenty of fresh fruits and vegetables, whole grains, low-fat dairy products, and lean protein like chicken and fish. °· Limit alcohol intake to no more than 1 drink a day for non-pregnant women and 2 drinks a day for men. One drink equals 12 oz of beer, 5 oz of wine, or 1½ oz of hard liquor. °· Check ingredients and nutrition facts on packaged foods and beverages. Avoid the following types of food: °? Food that is high in salt (sodium). °? Food that is high in saturated fat, like full-fat dairy or red meat. °? Food that is high in trans fat, like fried food. °? Food and drinks that are high in sugar. °Lifestyle °· Do not use any products that contain nicotine or tobacco, such as cigarettes and e-cigarettes. If you need help quitting, ask your health care provider. °· Take steps to manage and control your weight. °· Once cleared, get regular exercise. Aim for 150 minutes of moderate-intensity exercise (such as walking or yoga) or 75 minutes of vigorous exercise (such   as running or swimming) each week. °· Manage other health problems, such as diabetes or high blood pressure. Ask your health care provider how you can manage these conditions. °General instructions °· Do not drive for 24 hours after your procedure if you were given a medicine to help you relax (sedative). °· Take over-the-counter and prescription medicines only as told by your health care provider. °· Avoid putting pressure on the area where the cardiac device was placed. °· If you need an MRI after your cardiac device has been placed, be sure to tell the health care provider who orders the MRI  that you have a cardiac device. °· Avoid close and prolonged exposure to electrical devices that have strong magnetic fields. These include: °? Cell phones. Avoid keeping them in a pocket near the cardiac device, and try using the ear opposite the cardiac device. °? MP3 players. °? Household appliances, like microwaves. °? Metal detectors. °? Electric generators. °? High-tension wires. °· Keep all follow-up visits as directed by your health care provider. This is important. °Contact a health care provider if: °· You have pain at the incision site that is not relieved by over-the-counter or prescription medicines. °· You have any of these around your incision site or coming from it: °? More redness, swelling, or pain. °? Fluid or blood. °? Warmth to the touch. °? Pus or a bad smell. °· You have a fever. °· You feel brief, occasional palpitations, light-headedness, or any symptoms that you think might be related to your heart. °Get help right away if: °· You experience chest pain that is different from the pain at the cardiac device site. °· You develop a red streak that extends above or below the incision site. °· You experience shortness of breath. °· You have palpitations or an irregular heartbeat. °· You have light-headedness that does not go away quickly. °· You faint or have dizzy spells. °· Your pulse suddenly drops or increases rapidly and does not return to normal. °· You begin to gain weight and your legs and ankles swell. °Summary °· After your procedure, it is common to have pain, soreness, and some swelling where the cardiac device was inserted. °· Make sure to keep your incision clean and dry. Follow instructions from your health care provider about how to take care of your incision. °· Check your incision every day for signs of infection, such as more pain or swelling, pus or a bad smell, warmth, or leaking fluid and blood. °· Avoid strenuous exercise and lifting your left arm higher than your shoulder  for 2 weeks, or as long as told by your health care provider. °This information is not intended to replace advice given to you by your health care provider. Make sure you discuss any questions you have with your health care provider. ° °

## 2021-11-17 NOTE — Transfer of Care (Signed)
Immediate Anesthesia Transfer of Care Note  Patient: Francis Dowse, MD  Procedure(s) Performed: [place]  Patient Location: Cath Lab  Anesthesia Type:General  Level of Consciousness: awake, alert  and oriented  Airway & Oxygen Therapy: Patient Spontanous Breathing and Patient connected to nasal cannula oxygen  Post-op Assessment: Report given to RN, Post -op Vital signs reviewed and stable and Patient moving all extremities X 4  Post vital signs: Reviewed and stable  Last Vitals:  Vitals Value Taken Time  BP 131/69   Temp    Pulse 78   Resp 14   SpO2 98     Last Pain:  Vitals:   11/17/21 0550  TempSrc: Oral         Complications: No notable events documented.

## 2021-11-17 NOTE — Progress Notes (Signed)
[employer] rep at bedside, safety maintained

## 2021-11-18 ENCOUNTER — Encounter (HOSPITAL_COMMUNITY): Payer: Self-pay | Admitting: Internal Medicine

## 2021-11-18 MED FILL — Gentamicin Sulfate Inj 40 MG/ML: INTRAMUSCULAR | Qty: 80 | Status: AC

## 2021-11-18 MED FILL — Cefazolin Sodium-Dextrose IV Solution 2 GM/100ML-4%: INTRAVENOUS | Qty: 100 | Status: AC

## 2021-11-18 MED FILL — Heparin Sod (Porcine)-NaCl IV Soln 1000 Unit/500ML-0.9%: INTRAVENOUS | Qty: 500 | Status: AC

## 2021-11-18 NOTE — Anesthesia Postprocedure Evaluation (Signed)
Anesthesia Post Note  Patient: Bradley Dine, Bradley Castaneda  Procedure(s) Performed: Bradley Castaneda ICD CHANGEOUT     Patient location during evaluation: PACU Anesthesia Type: General Level of consciousness: awake and alert Pain management: pain level controlled Vital Signs Assessment: post-procedure vital signs reviewed and stable Respiratory status: spontaneous breathing, nonlabored ventilation, respiratory function stable and patient connected to nasal cannula oxygen Cardiovascular status: blood pressure returned to baseline and stable Postop Assessment: no apparent nausea or vomiting Anesthetic complications: no   No notable events documented.  Last Vitals:  Vitals:   11/17/21 1012 11/17/21 1027  BP: 129/84 (!) 132/95  Pulse: (!) 58 61  Resp:    Temp:    SpO2: 100% 100%    Last Pain:  Vitals:   11/17/21 1020  TempSrc:   PainSc: 0-No pain                 Bradley Castaneda

## 2021-11-27 ENCOUNTER — Other Ambulatory Visit: Payer: Self-pay

## 2021-11-27 ENCOUNTER — Ambulatory Visit (INDEPENDENT_AMBULATORY_CARE_PROVIDER_SITE_OTHER): Payer: No Typology Code available for payment source

## 2021-11-27 DIAGNOSIS — I422 Other hypertrophic cardiomyopathy: Secondary | ICD-10-CM | POA: Diagnosis not present

## 2021-11-27 NOTE — Progress Notes (Signed)
Wound check appointment. Dermabond removed. Wound without redness or edema. Incision edges approximated, wound well healed Subcutaneous ICD check in clinic.0 untreated episodes; 0 treated episodes; 0 shocks delivered. Electrode impedance status okay. No programming changes. Remaining longevity to ERI 100%

## 2021-12-16 ENCOUNTER — Other Ambulatory Visit: Payer: Self-pay | Admitting: Internal Medicine

## 2022-01-12 ENCOUNTER — Other Ambulatory Visit (HOSPITAL_BASED_OUTPATIENT_CLINIC_OR_DEPARTMENT_OTHER): Payer: Self-pay

## 2022-01-12 ENCOUNTER — Other Ambulatory Visit (HOSPITAL_COMMUNITY): Payer: Self-pay

## 2022-01-19 ENCOUNTER — Other Ambulatory Visit (HOSPITAL_BASED_OUTPATIENT_CLINIC_OR_DEPARTMENT_OTHER): Payer: Self-pay

## 2022-02-19 ENCOUNTER — Ambulatory Visit (INDEPENDENT_AMBULATORY_CARE_PROVIDER_SITE_OTHER): Payer: No Typology Code available for payment source

## 2022-02-19 ENCOUNTER — Telehealth: Payer: Self-pay | Admitting: Internal Medicine

## 2022-02-19 DIAGNOSIS — I422 Other hypertrophic cardiomyopathy: Secondary | ICD-10-CM

## 2022-02-19 NOTE — Telephone Encounter (Signed)
Patient is requesting documentation of any visits that were completed in regards to his recent pacemaker implant.  ?

## 2022-02-20 LAB — CUP PACEART REMOTE DEVICE CHECK
Battery Remaining Percentage: 99 %
Date Time Interrogation Session: 20230504210400
Implantable Lead Implant Date: 20170927
Implantable Lead Location: 753862
Implantable Lead Model: 3401
Implantable Pulse Generator Implant Date: 20230130
Pulse Gen Serial Number: 171972

## 2022-03-02 ENCOUNTER — Encounter: Payer: No Typology Code available for payment source | Admitting: Internal Medicine

## 2022-03-04 NOTE — Progress Notes (Signed)
Remote ICD transmission.   

## 2022-03-16 ENCOUNTER — Encounter: Payer: Self-pay | Admitting: Internal Medicine

## 2022-03-17 DIAGNOSIS — I4891 Unspecified atrial fibrillation: Secondary | ICD-10-CM | POA: Insufficient documentation

## 2022-03-18 ENCOUNTER — Ambulatory Visit: Payer: No Typology Code available for payment source | Admitting: Internal Medicine

## 2022-03-18 ENCOUNTER — Encounter: Payer: Self-pay | Admitting: Internal Medicine

## 2022-03-18 VITALS — BP 130/92 | HR 52 | Ht 69.5 in | Wt 172.4 lb

## 2022-03-18 DIAGNOSIS — I4729 Other ventricular tachycardia: Secondary | ICD-10-CM | POA: Diagnosis not present

## 2022-03-18 DIAGNOSIS — I4891 Unspecified atrial fibrillation: Secondary | ICD-10-CM

## 2022-03-18 DIAGNOSIS — I422 Other hypertrophic cardiomyopathy: Secondary | ICD-10-CM

## 2022-03-18 DIAGNOSIS — Z95810 Presence of automatic (implantable) cardiac defibrillator: Secondary | ICD-10-CM | POA: Diagnosis not present

## 2022-03-18 NOTE — Patient Instructions (Signed)
Medication Instructions:  Your physician recommends that you continue on your current medications as directed. Please refer to the Current Medication list given to you today.  *If you need a refill on your cardiac medications before your next appointment, please call your pharmacy*   Lab Work: None ordered.  If you have labs (blood work) drawn today and your tests are completely normal, you will receive your results only by: MyChart Message (if you have MyChart) OR A paper copy in the mail If you have any lab test that is abnormal or we need to change your treatment, we will call you to review the results.   Testing/Procedures: None ordered.    Follow-Up: At [facility], you and your health needs are our priority.  As part of our continuing mission to provide you with exceptional heart care, we have created designated Provider Care Teams.  These Care Teams include your primary Cardiologist (physician) and Advanced Practice Providers (APPs -  Physician Assistants and Nurse Practitioners) who all work together to provide you with the care you need, when you need it.  We recommend signing up for the patient portal called "MyChart".  Sign up information is provided on this After Visit Summary.  MyChart is used to connect with patients for Virtual Visits (Telemedicine).  Patients are able to view lab/test results, encounter notes, upcoming appointments, etc.  Non-urgent messages can be sent to your provider as well.   To learn more about what you can do with MyChart, go to [email or web address].    Your next appointment:   9 months with Dr Klein  Important Information About Sugar       

## 2022-03-18 NOTE — Progress Notes (Signed)
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  He has a history of nonsustained ventricular tachycardia.  He had gadolinium enhancement diffusely in the septum and apex and the anterior and inferior walls but without evidence of apical ballooning. He is status post S ICD implantation 9/17  Has had intercurrent afib, most recently 1/23 duration about 17 hrs.  anticoagulation with Apixoban   associated with shortness of breath dyspnea on exertion and some exertional lightheadedness;  heart rate was about 115  The patient denies chest pain, shortness of breath, nocturnal dyspnea, orthopnea or peripheral edema.  There have been no palpitations, lightheadedness or syncope.      Records and Results Reviewed Consultation notes from Dr. 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      Past Medical History:  Diagnosis Date   Apical variant hypertrophic cardiomyopathy ([place])    Elevated troponin    Hypercholesterolemia    NSVT (nonsustained ventricular tachycardia) ([place])     Past Surgical History:  Procedure Laterality Date   EP IMPLANTABLE DEVICE N/A 07/15/2016   Procedure: SubQ ICD Implant;  Surgeon: Deboraha Sprang, MD;  Location: [place] [facility];  Service: Cardiovascular;  Laterality: N/A;   KNEE SURGERY     SUBQ ICD CHANGEOUT N/A 11/17/2021   Procedure: SUBQ ICD CHANGEOUT;  Surgeon: Deboraha Sprang, MD;  Location: [place] [place] [facility];  Service: Cardiovascular;  Laterality: N/A;    Current Outpatient Medications  Medication Sig Dispense Refill   apixaban (ELIQUIS) 5 MG TABS tablet Take 1 tablet (5 mg total) by mouth 2 (two) times daily. 180 tablet 3   atorvastatin (LIPITOR) 20 MG tablet Take 1  tablet (20 mg total) by mouth daily. 90 tablet 4   diphenhydrAMINE (BENADRYL) 25 MG tablet Take 25 mg by mouth at bedtime as needed for allergies or sleep.     fluticasone (FLONASE) 50 MCG/ACT nasal spray Place 1-2 sprays into both nostrils daily as needed for allergies or rhinitis.     oxymetazoline (AFRIN) 0.05 % nasal spray Place 1 spray into both nostrils at bedtime as needed. Three-five times a week at night     psyllium (METAMUCIL) 58.6 % powder Take 1 packet by mouth 2 (two) times a week.     metoprolol tartrate (LOPRESSOR) 25 MG tablet Take 1 tablet (25 mg total) by mouth 2 (two) times daily as needed (for elevated heart rates). (Patient not taking: Reported on 03/18/2022) 30 tablet 1   naproxen sodium (ALEVE) 220 MG tablet Take 220-440 mg by mouth daily as needed (pain). (Patient not taking: Reported on 03/18/2022)     No current facility-administered medications for this visit.    No Known Allergies    Review of Systems negative except from HPI and PMH  Physical Exam BP (!) 130/92 (BP Location: Left Arm, Patient Position: Sitting, Cuff Size: Normal)   Pulse (!) 52   Ht 5' 9.5" (1.765 m)   Wt 172 lb 6.4 oz (78.2 kg)   SpO2 97%   BMI 25.09 kg/m  Well developed and well nourished in no acute distress HENT normal Neck supple with JVP-flat Clear Device pocket well healed; without hematoma or erythema.  There is no  tethering  Regular rate and rhythm, no  gallop No / murmur Abd-soft with active BS No Clubbing cyanosis  edema Skin-warm and dry A & Oriented  Grossly normal sensory and motor function  ECG sinus at 62 Intervals 15/09/41 ST-T changes leads II, 3, F, V4-V6 Prominent Q waves lead V1 and V2   Assessment and  Plan  Apical HCM MHY7 + gene   VT-NS  + GAD  SICD  Atrial fibrillation paroxysmal  Elevated blood pressure    Blood pressure reasonable  I would prefer 120's   Device has healed well post gen change  Recurrent Atrial fibrillation-- discussed  again catheter ablation  will review again as the new year approaches  [facility]ed [facility] and [facility]  Continue apixoban 5 mg bid   Son recently had an echo,  await result

## 2022-04-06 ENCOUNTER — Other Ambulatory Visit (HOSPITAL_BASED_OUTPATIENT_CLINIC_OR_DEPARTMENT_OTHER): Payer: Self-pay

## 2022-04-20 ENCOUNTER — Other Ambulatory Visit (HOSPITAL_COMMUNITY): Payer: Self-pay

## 2022-04-29 ENCOUNTER — Telehealth: Payer: Self-pay

## 2022-04-29 ENCOUNTER — Encounter: Payer: Self-pay | Admitting: Internal Medicine

## 2022-04-29 DIAGNOSIS — I4891 Unspecified atrial fibrillation: Secondary | ICD-10-CM

## 2022-04-29 NOTE — Telephone Encounter (Signed)
Patient has device. Will send to device clinic for advisement.

## 2022-04-29 NOTE — Telephone Encounter (Signed)
error 

## 2022-05-21 ENCOUNTER — Ambulatory Visit (INDEPENDENT_AMBULATORY_CARE_PROVIDER_SITE_OTHER): Payer: No Typology Code available for payment source

## 2022-05-21 DIAGNOSIS — I422 Other hypertrophic cardiomyopathy: Secondary | ICD-10-CM | POA: Diagnosis not present

## 2022-05-26 LAB — CUP PACEART REMOTE DEVICE CHECK
Battery Remaining Percentage: 96 %
Date Time Interrogation Session: 20230805153800
Implantable Lead Implant Date: 20170927
Implantable Lead Location: 753862
Implantable Lead Model: 3401
Implantable Pulse Generator Implant Date: 20230130
Pulse Gen Serial Number: 171972

## 2022-06-11 ENCOUNTER — Other Ambulatory Visit (HOSPITAL_BASED_OUTPATIENT_CLINIC_OR_DEPARTMENT_OTHER): Payer: Self-pay

## 2022-06-11 ENCOUNTER — Other Ambulatory Visit: Payer: Self-pay | Admitting: Internal Medicine

## 2022-06-11 MED ORDER — APIXABAN 5 MG PO TABS
5.0000 mg | ORAL_TABLET | Freq: Two times a day (BID) | ORAL | 1 refills | Status: DC
  Filled 2022-06-11: qty 180, 90d supply, fill #0
  Filled 2022-09-08: qty 180, 90d supply, fill #1

## 2022-06-11 NOTE — Telephone Encounter (Signed)
Prescription refill request for Eliquis received. Indication: Afib  Last office visit:03/18/22 Graciela Husbands)  Scr:1.23 (10/30/21)  Age: 51 Weight: 78.2kg  Appropriate dose and refill sent to requested pharmacy

## 2022-06-12 ENCOUNTER — Other Ambulatory Visit (HOSPITAL_BASED_OUTPATIENT_CLINIC_OR_DEPARTMENT_OTHER): Payer: Self-pay

## 2022-06-12 NOTE — Progress Notes (Signed)
Remote ICD transmission.   

## 2022-07-06 ENCOUNTER — Other Ambulatory Visit (HOSPITAL_BASED_OUTPATIENT_CLINIC_OR_DEPARTMENT_OTHER): Payer: Self-pay

## 2022-07-28 ENCOUNTER — Encounter: Payer: Self-pay | Admitting: Internal Medicine

## 2022-08-19 LAB — CUP PACEART REMOTE DEVICE CHECK
Battery Remaining Percentage: 93 %
Date Time Interrogation Session: 20231031122900
Implantable Lead Connection Status: 753985
Implantable Lead Implant Date: 20170927
Implantable Lead Location: 753862
Implantable Lead Model: 3401
Implantable Pulse Generator Implant Date: 20230130
Pulse Gen Serial Number: 171972

## 2022-08-20 ENCOUNTER — Ambulatory Visit (INDEPENDENT_AMBULATORY_CARE_PROVIDER_SITE_OTHER): Payer: No Typology Code available for payment source

## 2022-08-20 DIAGNOSIS — I422 Other hypertrophic cardiomyopathy: Secondary | ICD-10-CM | POA: Diagnosis not present

## 2022-09-01 NOTE — Progress Notes (Signed)
Remote ICD transmission.   

## 2022-09-06 IMAGING — CT CT CARDIAC CORONARY ARTERY CALCIUM SCORE
4 series · 12 of 20 positions shown, 13 images · non-contrast
Comparison: None.
COMPARISON: None.

Addendum:
EXAM:
OVER-READ INTERPRETATION  CT CHEST

The following report is an over-read performed by radiologist Dr.
Celestino Manuel Dector [REDACTED] on 01/23/2021. This
over-read does not include interpretation of cardiac or coronary
anatomy or pathology. The coronary calcium score/coronary CTA
interpretation by the cardiologist is attached.
CLINICAL DATA: Cardiovascular Disease Risk stratification
Coronary Calcium Score
TECHNIQUE: A gated, non-contrast computed tomography scan of the heart was
performed using 3mm slice thickness. Axial images were analyzed on a
dedicated workstation. Calcium scoring of the coronary arteries was
performed using the Agatston method.

[Series 2: casc 3.0 best diast 68 % (id) · axial · 0.39mm/px · z∈[+1286,+1364]mm · 3 of 52 slices shown, 4 images]
[im 13/52  vessel]
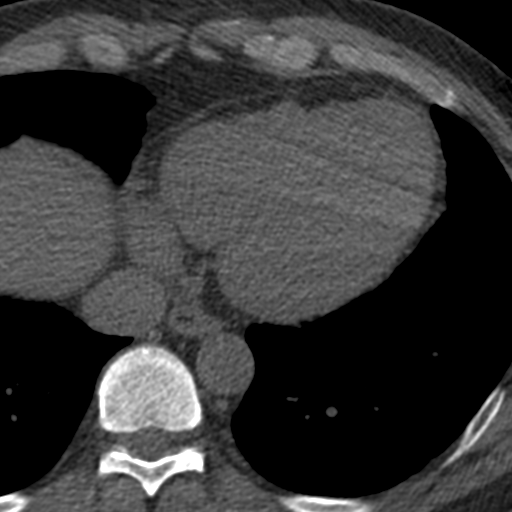
[im 13/52  lung]
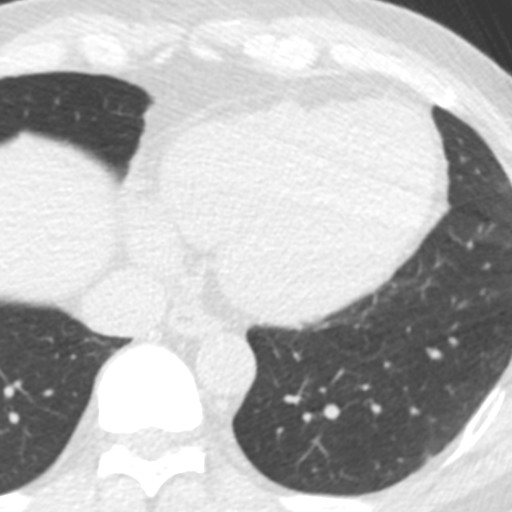
[im 26/52  vessel]
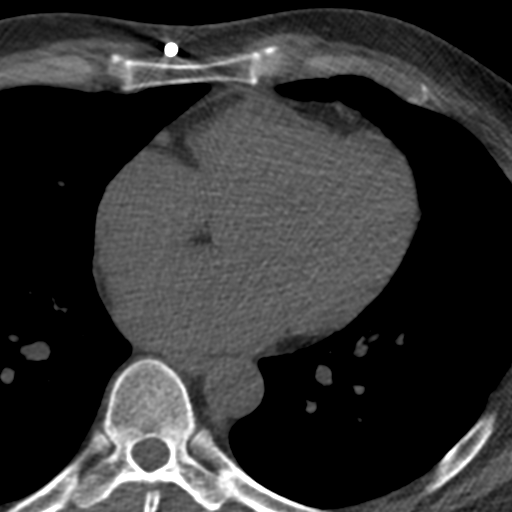
[im 39/52  vessel]
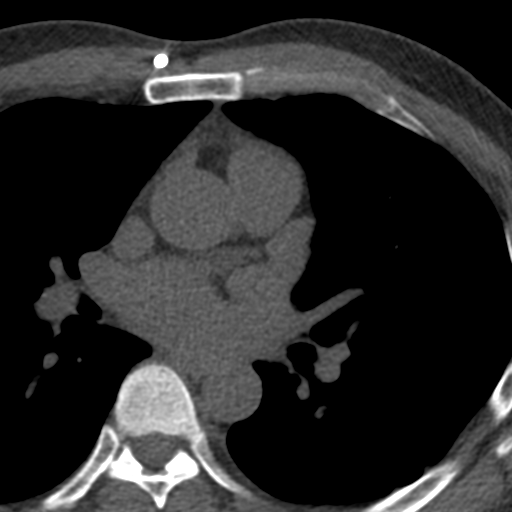

[Series 3: casc 3.0 best diast 68 % (id) (person_name) · axial · 0.39mm/px · z∈[+1286,+1364]mm · 3 of 52 slices shown]
[im 13/52  vessel]
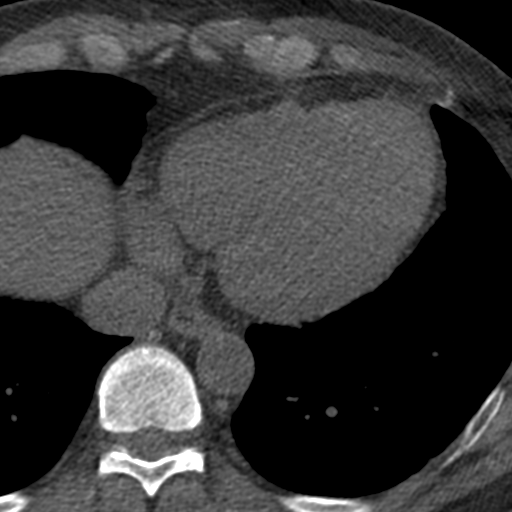
[im 26/52  vessel]
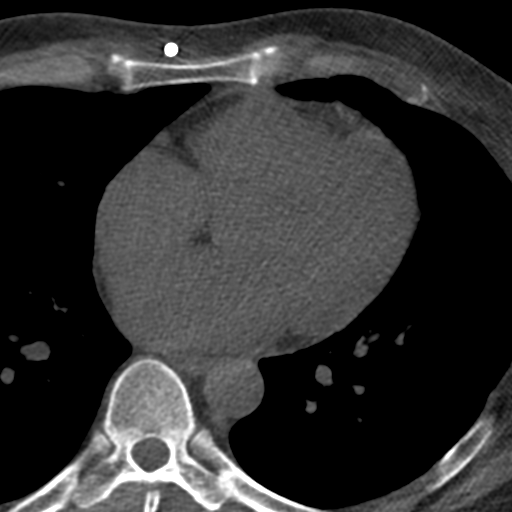
[im 39/52  vessel]
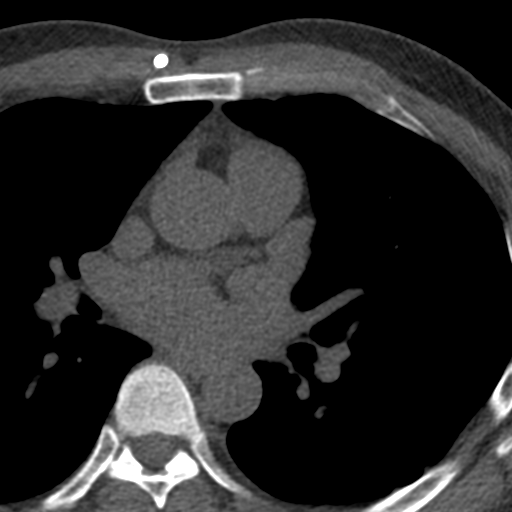

[Series 4: soft full fov 73 % (person_name) · axial · 0.73mm/px · z∈[+1286,+1364]mm · 3 of 52 slices shown]
[im 13/52  vessel]
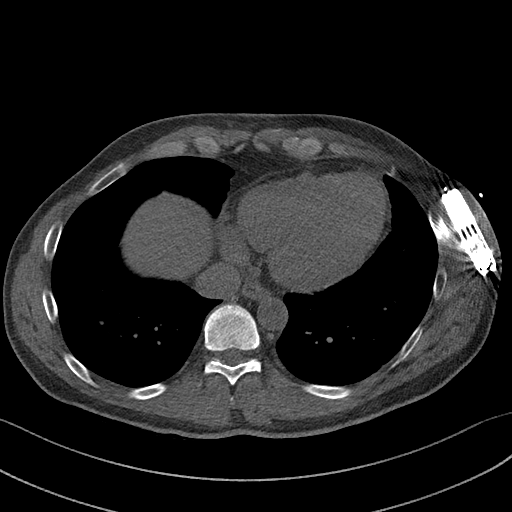
[im 26/52  vessel]
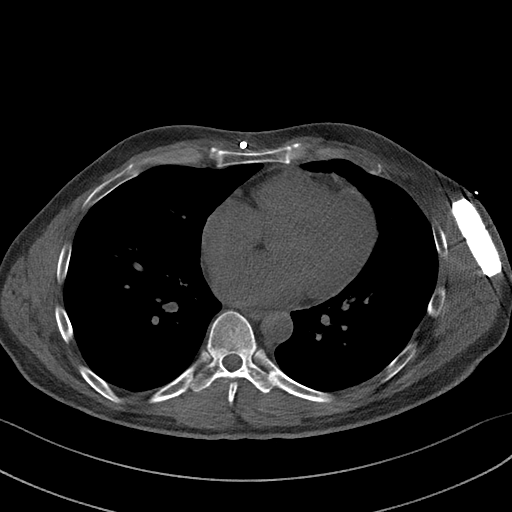
[im 39/52  vessel]
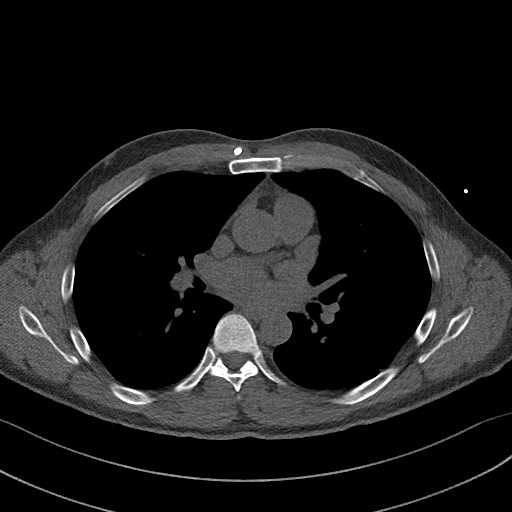

[Series 5: lungs 73 % · axial · 0.73mm/px · z∈[+1286,+1364]mm · 3 of 52 slices shown]
[im 13/52  vessel]
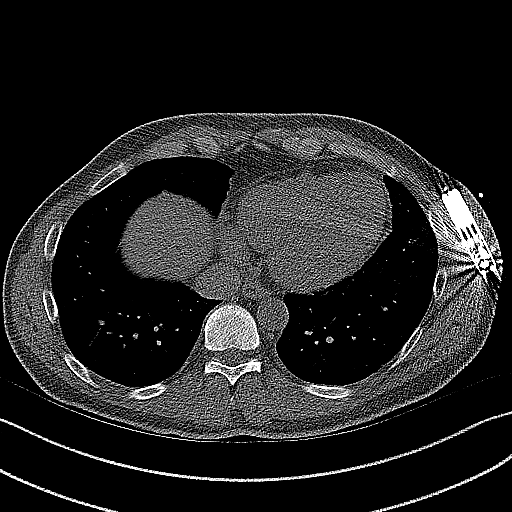
[im 26/52  vessel]
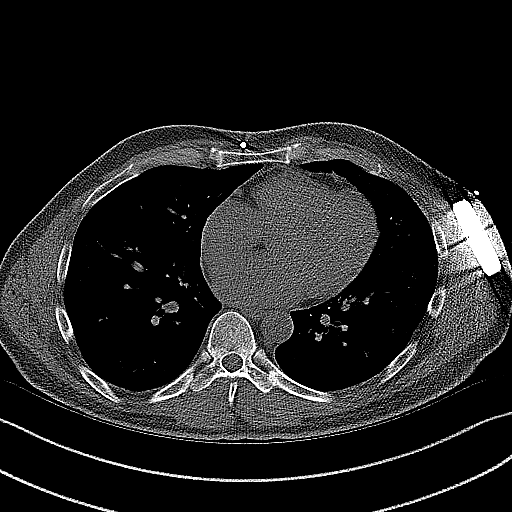
[im 39/52  vessel]
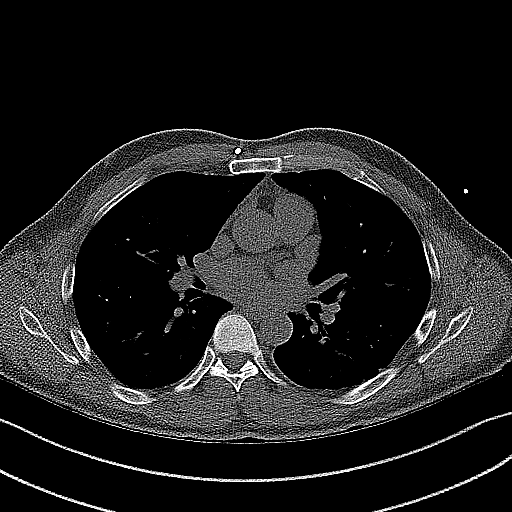

[12 of 20 positions shown; findings below may reference images not displayed]

FINDINGS: Within the visualized portions of the thorax there are no suspicious
appearing pulmonary nodules or masses, there is no acute
consolidative airspace disease, no pleural effusions, no
pneumothorax and no lymphadenopathy. Visualized portions of the
upper abdomen are unremarkable. There are no aggressive appearing
lytic or blastic lesions noted in the visualized portions of the
skeleton. Implanted defibrillator in the subcutaneous fat of the
left anterolateral chest wall with lead in the medial aspect of the
right pectoralis major muscle.
IMPRESSION: No significant incidental noncardiac findings are noted.
FINDINGS: Coronary arteries: Normal origins.

Coronary Calcium Score:

Left main: 0

Left anterior descending artery: 0

Left circumflex artery: 0

Right coronary artery: 0

Total: 0

Percentile: 0

Pericardium: Normal.

Aorta: Normal caliber of ascending aorta. No aortic atherosclerosis
noted.

Non-cardiac: See separate report from [REDACTED].
IMPRESSION: Coronary calcium score of 0. This was 0 percentile for age-, race-,
and sex-matched controls.



If CAC=0, it is reasonable to withhold statin therapy and reassess
in 5 to 10 years, as long as higher risk conditions are absent
(diabetes mellitus, family history of premature CHD in first degree
relatives (males <55 years; females <65 years), cigarette smoking,
or LDL >=190 mg/dL).

If CAC is 1 to 99, it is reasonable to initiate statin therapy for
patients >=55 years of age.

If CAC is >=100 or >=75th percentile, it is reasonable to initiate
statin therapy at any age.

Cardiology referral should be considered for patients with CAC
scores >=400 or >=75th percentile.

*5432 AHA/ACC/AACVPR/AAPA/ABC/FERIENHAUS/RENAUD/IKESHO/Satriadi/KOBER/TENT/ROBERTO
Guideline on the Management of Blood Cholesterol: A Report of the
American College of Cardiology/American Heart Association Task Force
on Clinical Practice Guidelines. J Am Coll Cardiol.
8885;73(24):1971-1322.

*** End of Addendum ***
EXAM:
OVER-READ INTERPRETATION  CT CHEST

The following report is an over-read performed by radiologist Dr.
Celestino Manuel Dector [REDACTED] on 01/23/2021. This
over-read does not include interpretation of cardiac or coronary
anatomy or pathology. The coronary calcium score/coronary CTA
interpretation by the cardiologist is attached.
FINDINGS: Within the visualized portions of the thorax there are no suspicious
appearing pulmonary nodules or masses, there is no acute
consolidative airspace disease, no pleural effusions, no
pneumothorax and no lymphadenopathy. Visualized portions of the
upper abdomen are unremarkable. There are no aggressive appearing
lytic or blastic lesions noted in the visualized portions of the
skeleton. Implanted defibrillator in the subcutaneous fat of the
left anterolateral chest wall with lead in the medial aspect of the
right pectoralis major muscle.
IMPRESSION: No significant incidental noncardiac findings are noted.

## 2022-09-08 ENCOUNTER — Other Ambulatory Visit (HOSPITAL_BASED_OUTPATIENT_CLINIC_OR_DEPARTMENT_OTHER): Payer: Self-pay

## 2022-09-09 ENCOUNTER — Other Ambulatory Visit (HOSPITAL_BASED_OUTPATIENT_CLINIC_OR_DEPARTMENT_OTHER): Payer: Self-pay

## 2022-10-06 ENCOUNTER — Other Ambulatory Visit (HOSPITAL_BASED_OUTPATIENT_CLINIC_OR_DEPARTMENT_OTHER): Payer: Self-pay

## 2022-10-07 ENCOUNTER — Other Ambulatory Visit (HOSPITAL_BASED_OUTPATIENT_CLINIC_OR_DEPARTMENT_OTHER): Payer: Self-pay

## 2022-10-07 ENCOUNTER — Other Ambulatory Visit (HOSPITAL_COMMUNITY): Payer: Self-pay

## 2022-10-07 MED ORDER — ATORVASTATIN CALCIUM 20 MG PO TABS
20.0000 mg | ORAL_TABLET | Freq: Every day | ORAL | 4 refills | Status: DC
  Filled 2022-10-07 (×2): qty 90, 90d supply, fill #0
  Filled 2022-12-30: qty 90, 90d supply, fill #1
  Filled 2023-04-14: qty 90, 90d supply, fill #2
  Filled 2023-07-17: qty 90, 90d supply, fill #3

## 2022-10-20 ENCOUNTER — Encounter: Payer: Self-pay | Admitting: Internal Medicine

## 2022-11-05 ENCOUNTER — Other Ambulatory Visit (HOSPITAL_BASED_OUTPATIENT_CLINIC_OR_DEPARTMENT_OTHER): Payer: Self-pay

## 2022-11-05 MED ORDER — AMOXICILLIN 500 MG PO CAPS
500.0000 mg | ORAL_CAPSULE | Freq: Three times a day (TID) | ORAL | 0 refills | Status: AC
  Filled 2022-11-05: qty 21, 7d supply, fill #0

## 2022-11-17 ENCOUNTER — Encounter: Payer: Self-pay | Admitting: Internal Medicine

## 2022-11-26 ENCOUNTER — Encounter (HOSPITAL_COMMUNITY): Payer: Self-pay | Admitting: *Deleted

## 2022-12-11 ENCOUNTER — Other Ambulatory Visit: Payer: Self-pay | Admitting: Internal Medicine

## 2022-12-11 DIAGNOSIS — I4891 Unspecified atrial fibrillation: Secondary | ICD-10-CM

## 2022-12-12 ENCOUNTER — Other Ambulatory Visit (HOSPITAL_BASED_OUTPATIENT_CLINIC_OR_DEPARTMENT_OTHER): Payer: Self-pay

## 2022-12-14 ENCOUNTER — Other Ambulatory Visit (HOSPITAL_BASED_OUTPATIENT_CLINIC_OR_DEPARTMENT_OTHER): Payer: Self-pay

## 2022-12-14 MED ORDER — APIXABAN 5 MG PO TABS
5.0000 mg | ORAL_TABLET | Freq: Two times a day (BID) | ORAL | 1 refills | Status: DC
  Filled 2022-12-14: qty 180, 90d supply, fill #0
  Filled 2023-03-12: qty 180, 90d supply, fill #1

## 2022-12-14 NOTE — Telephone Encounter (Signed)
Prescription refill request for Eliquis received. Indication: Afib  Last office visit: 03/18/22 Caryl Comes)  Scr: 1.3 (09/24/22 via [place])  Age: 52 Weight: 78.2kg  Appropriate dose. Refill sent.

## 2022-12-17 ENCOUNTER — Encounter: Payer: Self-pay | Admitting: Internal Medicine

## 2022-12-24 DIAGNOSIS — Z95810 Presence of automatic (implantable) cardiac defibrillator: Secondary | ICD-10-CM | POA: Diagnosis not present

## 2022-12-24 DIAGNOSIS — I480 Paroxysmal atrial fibrillation: Secondary | ICD-10-CM | POA: Diagnosis not present

## 2022-12-24 DIAGNOSIS — I472 Ventricular tachycardia, unspecified: Secondary | ICD-10-CM | POA: Diagnosis not present

## 2022-12-24 DIAGNOSIS — I422 Other hypertrophic cardiomyopathy: Secondary | ICD-10-CM | POA: Diagnosis not present

## 2023-01-04 ENCOUNTER — Other Ambulatory Visit (HOSPITAL_BASED_OUTPATIENT_CLINIC_OR_DEPARTMENT_OTHER): Payer: Self-pay

## 2023-01-04 ENCOUNTER — Other Ambulatory Visit: Payer: Self-pay | Admitting: Internal Medicine

## 2023-01-04 MED ORDER — METOPROLOL TARTRATE 25 MG PO TABS
25.0000 mg | ORAL_TABLET | Freq: Two times a day (BID) | ORAL | 0 refills | Status: AC | PRN
Start: 2023-01-04 — End: ?
  Filled 2023-01-04: qty 90, 45d supply, fill #0

## 2023-03-02 ENCOUNTER — Ambulatory Visit (INDEPENDENT_AMBULATORY_CARE_PROVIDER_SITE_OTHER): Payer: No Typology Code available for payment source

## 2023-03-02 DIAGNOSIS — I422 Other hypertrophic cardiomyopathy: Secondary | ICD-10-CM | POA: Diagnosis not present

## 2023-03-03 LAB — CUP PACEART REMOTE DEVICE CHECK
Battery Remaining Percentage: 87 %
Date Time Interrogation Session: 20240514193700
Implantable Lead Connection Status: 753985
Implantable Lead Implant Date: 20170927
Implantable Lead Location: 753862
Implantable Lead Model: 3401
Implantable Pulse Generator Implant Date: 20230130
Pulse Gen Serial Number: 171972

## 2023-03-07 NOTE — Progress Notes (Unsigned)
Cardiology Office Note Date:  03/07/2023  Patient ID:  Bradley Dine, MD, DOB [birth year], MRN [id number] PCP:  Leia Alf, MD (Inactive)  Electrophysiologist: Dr. 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 Graciela Husbands 03/18/22, was s/p gen change, site well healed, feeling well, discussed catheter ablation with plans to d/w [facility] docs.  His son had an echo, unknown/pending his results.  He saw Dr. Sherron Ales at [facility] 12/24/22, for Afib management, discussed awareness of his AFib, though minimally symptomatic and low burden.  No plans for AAD/ablation, to f/u again in a year  *** symptoms *** syncope *** Afib burden *** eliquis, bleeding, dose, labs  Device information BSci S-ICD implanted 07/15/2016, gen change 11/17/21   Past Medical History:  Diagnosis Date   Apical variant hypertrophic cardiomyopathy (HCC)    Elevated troponin    Hypercholesterolemia    NSVT (nonsustained ventricular tachycardia) (HCC)     Past Surgical History:  Procedure Laterality Date   EP IMPLANTABLE DEVICE N/A 07/15/2016   Procedure: SubQ ICD Implant;  Surgeon: [facility] Salvia, MD;  Location: [facility] INVASIVE CV LAB;  Service: Cardiovascular;  Laterality: N/A;   KNEE SURGERY     SUBQ ICD CHANGEOUT N/A 11/17/2021   Procedure: SUBQ ICD CHANGEOUT;  Surgeon: [facility] Salvia, MD;  Location: [facility] INVASIVE CV LAB;  Service: Cardiovascular;  Laterality: N/A;    Current Outpatient Medications  Medication Sig Dispense Refill   apixaban (ELIQUIS) 5 MG TABS tablet Take 1 tablet (5 mg total) by mouth 2 (two) times daily. 180 tablet 1   atorvastatin (LIPITOR) 20 MG tablet Take 1 tablet (20 mg total) by mouth  daily. 90 tablet 4   diphenhydrAMINE (BENADRYL) 25 MG tablet Take 25 mg by mouth at bedtime as needed for allergies or sleep.     fluticasone (FLONASE) 50 MCG/ACT nasal spray Place 1-2 sprays into both nostrils daily as needed for allergies or rhinitis.     metoprolol tartrate (LOPRESSOR) 25 MG tablet Take 1 tablet (25 mg total) by mouth 2 (two) times daily as needed (for elevated heart rates). 90 tablet 0   naproxen sodium (ALEVE) 220 MG tablet Take 220-440 mg by mouth daily as needed (pain). (Patient not taking: Reported on 03/18/2022)     oxymetazoline (AFRIN) 0.05 % nasal spray Place 1 spray into both nostrils at bedtime as needed. Three-five times a week at night     psyllium (METAMUCIL) 58.6 % powder Take 1 packet by mouth 2 (two) times a week.     No current facility-administered medications for this visit.    Allergies:   Patient has no known allergies.   Social History:  The patient  reports that he has never smoked. He has never used smokeless tobacco. He reports current alcohol use of about 1.0 - 2.0 standard drink of alcohol per week. He reports that he does not use drugs.   Family History:  The patient's family history includes CAD in his mother; Diabetes in his father; Heart disease in his father; Hypertension in his father, mother, and sister.  ROS:  Please see the history of present illness.    All other systems are reviewed and  otherwise negative.   PHYSICAL EXAM:  VS:  There were no vitals taken for this visit. BMI: There is no height or weight on file to calculate BMI. Well nourished, well developed, in no acute distress HEENT: normocephalic, atraumatic Neck: no JVD, carotid bruits or masses Cardiac:  *** RRR; no significant murmurs, no rubs, or gallops Lungs:  *** CTA b/l, no wheezing, rhonchi or rales Abd: soft, nontender MS: no deformity or *** atrophy Ext: *** no edema Skin: warm and dry, no rash Neuro:  No gross deficits appreciated Psych: euthymic mood, full  affect  *** ICD site is stable, no tethering or discomfort   EKG:  Done today and reviewed by myself shows  ***  Device interrogation done today and reviewed by myself:  ***   04/22/21; TTE  1. Left ventricular ejection fraction, by estimation, is 60 to 65%. The  left ventricle has normal function. The left ventricle has no regional  wall motion abnormalities. There is mild concentric left ventricular  hypertrophy. Left ventricular diastolic  parameters are consistent with Grade I diastolic dysfunction (impaired  relaxation).   2. Right ventricular systolic function is normal. The right ventricular  size is normal. There is normal pulmonary artery systolic pressure. The  estimated right ventricular systolic pressure is 29.0 mmHg.   3. The mitral valve is normal in structure. Trivial mitral valve  regurgitation. No evidence of mitral stenosis.   4. The aortic valve is tricuspid. Aortic valve regurgitation is not  visualized. Mild aortic valve sclerosis is present, with no evidence of  aortic valve stenosis.   5. The inferior vena cava is normal in size with greater than 50%  respiratory variability, suggesting right atrial pressure of 3 mmHg.    11/14/2015: c.MRI IMPRESSION: 1) Normal LV size with morphology consistent with apical hypertrophic cardiomyopathy Also some hypertrophy of the mid septum. Apical cavity obliteration in systole   2) No SAM or LVOT gradient   3) Diffuse delayed hyper- enhancement with gadolinium in septum apex but also anterior and inferior wall mid myocardium   4) Quantitative EF 75%   5) Normal RV   Recent Labs: No results found for requested labs within last 365 days.  No results found for requested labs within last 365 days.   CrCl cannot be calculated (Patient's most recent lab result is older than the maximum 21 days allowed.).   Wt Readings from Last 3 Encounters:  03/18/22 172 lb 6.4 oz (78.2 kg)  11/17/21 166 lb (75.3 kg)  10/30/21  169 lb 12.8 oz (77 kg)     Other studies reviewed: Additional studies/records reviewed today include: summarized above  ASSESSMENT AND PLAN:  ICD ***  HCM  ***   Disposition: F/u with ***  Current medicines are reviewed at length with the patient today.  The patient did not have any concerns regarding medicines.  Norma Fredrickson, PA-C 03/07/2023 11:25 AM     [facility] [address] [place] [address] [phone] (office)  [phone] (fax)

## 2023-03-09 ENCOUNTER — Encounter: Payer: Self-pay | Admitting: Physician Assistant

## 2023-03-09 ENCOUNTER — Ambulatory Visit: Payer: Commercial Managed Care - PPO | Attending: Physician Assistant | Admitting: Physician Assistant

## 2023-03-09 VITALS — BP 154/100 | HR 58 | Ht 69.5 in | Wt 169.0 lb

## 2023-03-09 DIAGNOSIS — I480 Paroxysmal atrial fibrillation: Secondary | ICD-10-CM | POA: Diagnosis not present

## 2023-03-09 DIAGNOSIS — I422 Other hypertrophic cardiomyopathy: Secondary | ICD-10-CM

## 2023-03-09 DIAGNOSIS — Z95810 Presence of automatic (implantable) cardiac defibrillator: Secondary | ICD-10-CM | POA: Diagnosis not present

## 2023-03-09 DIAGNOSIS — I4729 Other ventricular tachycardia: Secondary | ICD-10-CM | POA: Diagnosis not present

## 2023-03-09 DIAGNOSIS — D6869 Other thrombophilia: Secondary | ICD-10-CM

## 2023-03-09 LAB — CUP PACEART INCLINIC DEVICE CHECK
Date Time Interrogation Session: 20240521103911
Implantable Lead Connection Status: 753985
Implantable Lead Implant Date: 20170927
Implantable Lead Location: 753862
Implantable Lead Model: 3401
Implantable Pulse Generator Implant Date: 20230130
Pulse Gen Serial Number: 171972

## 2023-03-09 NOTE — Patient Instructions (Signed)
Medication Instructions:    Your physician recommends that you continue on your current medications as directed. Please refer to the Current Medication list given to you today.  *If you need a refill on your cardiac medications before your next appointment, please call your pharmacy*   Lab Work:  NONE ORDERED  TODAY   If you have labs (blood work) drawn today and your tests are completely normal, you will receive your results only by: MyChart Message (if you have MyChart) OR A paper copy in the mail If you have any lab test that is abnormal or we need to change your treatment, we will call you to review the results.   Testing/Procedures: NONE ORDERED  TODAY     Follow-Up: At [facility], you and your health needs are our priority.  As part of our continuing mission to provide you with exceptional heart care, we have created designated Provider Care Teams.  These Care Teams include your primary Cardiologist (physician) and Advanced Practice Providers (APPs -  Physician Assistants and Nurse Practitioners) who all work together to provide you with the care you need, when you need it.  We recommend signing up for the patient portal called "MyChart".  Sign up information is provided on this After Visit Summary.  MyChart is used to connect with patients for Virtual Visits (Telemedicine).  Patients are able to view lab/test results, encounter notes, upcoming appointments, etc.  Non-urgent messages can be sent to your provider as well.   To learn more about what you can do with MyChart, go to [email or web address].    Your next appointment:   1 year(s)  Provider:   You may see Sherryl Manges, MD or one of the following Advanced Practice Providers on your designated Care Team:   Francis Dowse, [place]  Other Instructions

## 2023-03-12 ENCOUNTER — Other Ambulatory Visit (HOSPITAL_BASED_OUTPATIENT_CLINIC_OR_DEPARTMENT_OTHER): Payer: Self-pay

## 2023-03-30 NOTE — Progress Notes (Signed)
Remote ICD transmission.   

## 2023-04-14 ENCOUNTER — Other Ambulatory Visit (HOSPITAL_BASED_OUTPATIENT_CLINIC_OR_DEPARTMENT_OTHER): Payer: Self-pay

## 2023-05-12 ENCOUNTER — Encounter: Payer: Self-pay | Admitting: Internal Medicine

## 2023-05-12 ENCOUNTER — Telehealth: Payer: Self-pay

## 2023-05-12 DIAGNOSIS — I4891 Unspecified atrial fibrillation: Secondary | ICD-10-CM

## 2023-05-12 DIAGNOSIS — I422 Other hypertrophic cardiomyopathy: Secondary | ICD-10-CM

## 2023-05-12 DIAGNOSIS — Z01818 Encounter for other preprocedural examination: Secondary | ICD-10-CM

## 2023-05-12 NOTE — Telephone Encounter (Signed)
Patient writing in on MyChart to report he thinks he has been in afib for 4 days. He reports he sent a transmission over yesterday, transmission retrieved. He reports "At the end of a set of exercises I'm a little out of breath like usual, just lasts 10 to 20 sec longer. HR 80s at rest, 100 on treadmill. 120 max with lifting". However, he states when he is in afib he usually converts before now.  Presented transmission and last OV note with Francis Dowse on 03/09/23 to DOD Dr. Izora Ribas who spoke with patient and advised cardioversion. Call to cathlab/central scheduling to request cardioversion for Friday 05/14/23 with Dr. Jens Som, await response. Appt scheduled w/ DOD Dr. Nelly Laurence on Thursday 05/13/23 for H&P, EKG, CBC and BMP. Patient agrees to plan.

## 2023-05-12 NOTE — Telephone Encounter (Signed)
Per Salley Hews [phone]) patient is scheduled for 12:45 cardioversion on Friday 05/14/23 with Dr. Jens Som, arrival time 11:45. Patient confirms he has not missed any doses of his eliquis and verbalizes understanding that he has appt w/ Dr. Nelly Laurence tomorrow 05/13/23 at 1:30 at [facility] office. Staff message sent to [facility] pre-auth.

## 2023-05-12 NOTE — Addendum Note (Signed)
Addended by: Luellen Pucker on: 05/12/2023 04:07 PM   Modules accepted: Orders

## 2023-05-12 NOTE — Telephone Encounter (Signed)
Patient w/ history of apical HCM.

## 2023-05-13 ENCOUNTER — Ambulatory Visit: Payer: Commercial Managed Care - PPO | Attending: Cardiovascular Disease | Admitting: Cardiovascular Disease

## 2023-05-13 ENCOUNTER — Other Ambulatory Visit: Payer: Self-pay | Admitting: Internal Medicine

## 2023-05-13 ENCOUNTER — Encounter: Payer: Self-pay | Admitting: Cardiovascular Disease

## 2023-05-13 VITALS — BP 138/90 | HR 75 | Ht 69.0 in | Wt 171.0 lb

## 2023-05-13 DIAGNOSIS — Z01810 Encounter for preprocedural cardiovascular examination: Secondary | ICD-10-CM

## 2023-05-13 DIAGNOSIS — D6869 Other thrombophilia: Secondary | ICD-10-CM | POA: Diagnosis not present

## 2023-05-13 DIAGNOSIS — I422 Other hypertrophic cardiomyopathy: Secondary | ICD-10-CM | POA: Diagnosis not present

## 2023-05-13 DIAGNOSIS — I4891 Unspecified atrial fibrillation: Secondary | ICD-10-CM

## 2023-05-13 LAB — CUP PACEART INCLINIC DEVICE CHECK
Battery Status: 85
Date Time Interrogation Session: 20240725164809
Implantable Lead Connection Status: 753985
Implantable Lead Implant Date: 20170927
Implantable Lead Location: 753862
Implantable Lead Model: 3401
Implantable Pulse Generator Implant Date: 20230130
Pulse Gen Serial Number: 171972

## 2023-05-13 NOTE — OR Nursing (Signed)
Called patient with pre-procedure instructions for tomorrow.   Patient informed of:   Time to arrive for procedure. 1115 Remain NPO past midnight. Agreed. Must have a ride home and a responsible adult to remain with them for 24 ours post procedure. Confirmed. Confirmed blood thinner. Eliquis. Confirmed no breaks in taking blood thinner for 3+ weeks prior to procedure. Confirmed. Confirmed patient stopped all GLP-1s and GLP-2s for at least one week before procedure. N/A  Spoke with patient. Agreed to above information.

## 2023-05-13 NOTE — Progress Notes (Signed)
  Electrophysiology Office Note:    Date:  05/13/2023   ID:  Bradley Dine, Bradley Castaneda, DOB 06-Dec-1970, MRN [id number]  PCP:  Leia Alf, Bradley Castaneda (Inactive)    [facility] Providers Cardiologist:  None Electrophysiologist:  Sherryl Manges, Bradley Castaneda     Referring Bradley Castaneda: No ref. provider found   History of Present Illness:    Bradley Dine, Bradley Castaneda is a 52 y.o. 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     He has a subcutaneous ICD.  He has been evaluated at [facility] for ablation. This was deferred for now since his burden has been low, and his episodes have been paroxysmal.     Device interrogation today shows 8 days of atrial fibrillation since may. The patient confirmed that he had an episode lasting about 3 days in May, and the current episode has been ongoing for about 5 days. The patient spoke with DOD, Dr. Izora Ribas yesterday who advised cardioversion, which is scheduled for tomorrow. He is here for pre-procedural evaluation.  His symptoms are rather mild. He has elevated heart rate and is aware of his AF.   EKGs/Labs/Other Studies Reviewed Today:    Echocardiogram:  TTE 04/22/2021 EF 60-65%; mild concentric LVH   Monitors:   Stress testing:   Advanced imaging:   Cardiac catherization    EKG:   EKG Interpretation Date/Time:  Thursday May 13 2023 13:45:52 EDT Ventricular Rate:  75 PR Interval:    QRS Duration:  80 QT Interval:  384 QTC Calculation: 428 R Axis:   14  Text Interpretation: Atrial fibrillation ST & T wave abnormality, consider inferolateral ischemia When compared with ECG of 07-May-2021 09:43, Atrial fibrillation has replaced Sinus rhythm Confirmed by York Pellant [id number]) on  05/13/2023 1:58:09 PM     Physical Exam:    VS:  BP (!) 138/90   Pulse 75   Ht 5\' 9"  (1.753 m)   Wt 171 lb (77.6 kg)   SpO2 99%   BMI 25.25 kg/m     Wt Readings from Last 3 Encounters:  05/13/23 171 lb (77.6 kg)  03/09/23 169 lb (76.7 kg)  03/18/22 172 lb 6.4 oz (78.2 kg)     GEN: Well nourished, well developed in no acute distress CARDIAC: iRRR, no murmurs, rubs, gallops RESPIRATORY:  Normal work of breathing MUSCULOSKELETAL: no edema    ASSESSMENT & PLAN:     Paroxysmal atrial fibrillation Longest episode to-date is currently underway, lasting about 5 days He will follow-up with his EP at [facility] to reconsider rhythm control strategies  Apical HCM No evidence of heart failure No obstruction Icd in place with apical variant and presence of LGE  Secondary hypercoagulable state Continue apixaban 5    Signed, Maurice Small, Bradley Castaneda  05/13/2023 2:02 PM     [facility]

## 2023-05-13 NOTE — Patient Instructions (Signed)
Medication Instructions:  Your physician recommends that you continue on your current medications as directed. Please refer to the Current Medication list given to you today. *If you need a refill on your cardiac medications before your next appointment, please call your pharmacy*   Lab Work: CBC and BMET TODAY If you have labs (blood work) drawn today and your tests are completely normal, you will receive your results only by: MyChart Message (if you have MyChart) OR A paper copy in the mail If you have any lab test that is abnormal or we need to change your treatment, we will call you to review the results.   Testing/Procedures: Cardioversion scheduled for tomorrow, 05/14/23   Follow-Up: At [facility], you and your health needs are our priority.  As part of our continuing mission to provide you with exceptional heart care, we have created designated Provider Care Teams.  These Care Teams include your primary Cardiologist (physician) and Advanced Practice Providers (APPs -  Physician Assistants and Nurse Practitioners) who all work together to provide you with the care you need, when you need it.  We recommend signing up for the patient portal called "MyChart".  Sign up information is provided on this After Visit Summary.  MyChart is used to connect with patients for Virtual Visits (Telemedicine).  Patients are able to view lab/test results, encounter notes, upcoming appointments, etc.  Non-urgent messages can be sent to your provider as well.   To learn more about what you can do with MyChart, go to [email or web address].    Your next appointment:   Routine follow up with   Provider:   Sherryl Manges, MD

## 2023-05-14 ENCOUNTER — Ambulatory Visit (HOSPITAL_COMMUNITY): Payer: Commercial Managed Care - PPO | Admitting: Anesthesiology

## 2023-05-14 ENCOUNTER — Encounter (HOSPITAL_COMMUNITY)
Admission: RE | Disposition: A | Discharge status: Home / Self Care | Payer: Self-pay | Source: Home / Self Care | Attending: Cardiology

## 2023-05-14 ENCOUNTER — Encounter (HOSPITAL_COMMUNITY): Payer: Self-pay | Admitting: Cardiology

## 2023-05-14 ENCOUNTER — Other Ambulatory Visit: Payer: Self-pay

## 2023-05-14 ENCOUNTER — Ambulatory Visit (HOSPITAL_BASED_OUTPATIENT_CLINIC_OR_DEPARTMENT_OTHER): Payer: Commercial Managed Care - PPO | Admitting: Anesthesiology

## 2023-05-14 ENCOUNTER — Ambulatory Visit (HOSPITAL_COMMUNITY)
Admission: RE | Admit: 2023-05-14 | Discharge: 2023-05-14 | Disposition: A | Discharge status: Home / Self Care | Payer: Commercial Managed Care - PPO | Attending: Cardiology | Admitting: Cardiology

## 2023-05-14 DIAGNOSIS — I480 Paroxysmal atrial fibrillation: Secondary | ICD-10-CM | POA: Insufficient documentation

## 2023-05-14 DIAGNOSIS — I4891 Unspecified atrial fibrillation: Secondary | ICD-10-CM | POA: Diagnosis not present

## 2023-05-14 DIAGNOSIS — I4729 Other ventricular tachycardia: Secondary | ICD-10-CM | POA: Diagnosis not present

## 2023-05-14 DIAGNOSIS — Z95810 Presence of automatic (implantable) cardiac defibrillator: Secondary | ICD-10-CM | POA: Insufficient documentation

## 2023-05-14 DIAGNOSIS — I4819 Other persistent atrial fibrillation: Secondary | ICD-10-CM | POA: Diagnosis not present

## 2023-05-14 DIAGNOSIS — D6869 Other thrombophilia: Secondary | ICD-10-CM | POA: Insufficient documentation

## 2023-05-14 DIAGNOSIS — I422 Other hypertrophic cardiomyopathy: Secondary | ICD-10-CM | POA: Diagnosis not present

## 2023-05-14 HISTORY — PX: CARDIOVERSION: SHX1299

## 2023-05-14 SURGERY — CARDIOVERSION
Anesthesia: General

## 2023-05-14 MED ORDER — LIDOCAINE 2% (20 MG/ML) 5 ML SYRINGE
INTRAMUSCULAR | Status: DC | PRN
  Administered 2023-05-14: 60 mg via INTRAVENOUS

## 2023-05-14 MED ORDER — PROPOFOL 10 MG/ML IV BOLUS
INTRAVENOUS | Status: DC | PRN
  Administered 2023-05-14: 80 mg via INTRAVENOUS

## 2023-05-14 MED ORDER — SODIUM CHLORIDE 0.9 % IV SOLN: INTRAVENOUS | Status: DC

## 2023-05-14 SURGICAL SUPPLY — 1 items: ELECT DEFIB PAD ADLT CADENCE (PAD) ×2 IMPLANT

## 2023-05-14 NOTE — Interval H&P Note (Signed)
History and Physical Interval Note:  05/14/2023 10:49 AM  [name], MD  has presented today for surgery, with the diagnosis of afib.  The various methods of treatment have been discussed with the patient and family. After consideration of risks, benefits and other options for treatment, the patient has consented to  Procedure(s): CARDIOVERSION (N/A) as a surgical intervention.  The patient's history has been reviewed, patient examined, no change in status, stable for surgery.  I have reviewed the patient's chart and labs.  Questions were answered to the patient's satisfaction.     Bradley Castaneda

## 2023-05-14 NOTE — Anesthesia Preprocedure Evaluation (Addendum)
Anesthesia Evaluation  Patient identified by MRN, date of birth, ID band Patient awake    Reviewed: Allergy & Precautions, NPO status , Patient's Chart, lab work & pertinent test results, reviewed documented beta blocker date and time   Airway Mallampati: II  TM Distance: >3 FB Neck ROM: Full    Dental  (+) Teeth Intact, Dental Advisory Given   Pulmonary neg pulmonary ROS   Pulmonary exam normal breath sounds clear to auscultation       Cardiovascular Normal cardiovascular exam+ dysrhythmias (hx NSV. eliquis) Atrial Fibrillation + Cardiac Defibrillator (s/p gen change 2023)  Rhythm:Irregular Rate:Normal  Echo 2022  1. Left ventricular ejection fraction, by estimation, is 60 to 65%. The  left ventricle has normal function. The left ventricle has no regional  wall motion abnormalities. There is mild concentric left ventricular  hypertrophy. Left ventricular diastolic  parameters are consistent with Grade I diastolic dysfunction (impaired  relaxation).   2. Right ventricular systolic function is normal. The right ventricular  size is normal. There is normal pulmonary artery systolic pressure. The  estimated right ventricular systolic pressure is 29.0 mmHg.   3. The mitral valve is normal in structure. Trivial mitral valve  regurgitation. No evidence of mitral stenosis.   4. The aortic valve is tricuspid. Aortic valve regurgitation is not  visualized. Mild aortic valve sclerosis is present, with no evidence of  aortic valve stenosis.   5. The inferior vena cava is normal in size with greater than 50%  respiratory variability, suggesting right atrial pressure of 3 mmHg.    Stress test 2017  ST segment depression was noted during stress.  T wave inversion was noted during stress.  Blood pressure demonstrated a hypertensive response to exercise.     Neuro/Psych negative neurological ROS  negative psych ROS    GI/Hepatic negative GI ROS, Neg liver ROS,,,  Endo/Other  negative endocrine ROS    Renal/GU negative Renal ROS  negative genitourinary   Musculoskeletal negative musculoskeletal ROS (+)    Abdominal   Peds  Hematology negative hematology ROS (+)   Anesthesia Other Findings   Reproductive/Obstetrics negative OB ROS                             Anesthesia Physical Anesthesia Plan  ASA: 3  Anesthesia Plan: General   Post-op Pain Management:    Induction: Intravenous  PONV Risk Score and Plan: TIVA and Treatment may vary due to age or medical condition  Airway Management Planned: Natural Airway and Mask  Additional Equipment: None  Intra-op Plan:   Post-operative Plan:   Informed Consent: I have reviewed the patients History and Physical, chart, labs and discussed the procedure including the risks, benefits and alternatives for the proposed anesthesia with the patient or authorized representative who has indicated his/her understanding and acceptance.       Plan Discussed with: CRNA  Anesthesia Plan Comments:        Anesthesia Quick Evaluation

## 2023-05-14 NOTE — Procedures (Signed)
Electrical Cardioversion Procedure Note JOVAUN FRETWELL, MD [id number] 10-Sep-1971  Procedure: Electrical Cardioversion Indications:  Atrial Fibrillation  Procedure Details Consent: Risks of procedure as well as the alternatives and risks of each were explained to the (patient/caregiver).  Consent for procedure obtained. Time Out: Verified patient identification, verified procedure, site/side was marked, verified correct patient position, special equipment/implants available, medications/allergies/relevent history reviewed, required imaging and test results available.  Performed  Patient placed on cardiac monitor, pulse oximetry, supplemental oxygen as necessary.  Sedation given:  Pt sedated by anesthesia with lidocaine 60 mg and diprovan 80 mg IV. Pacer pads placed anterior and posterior chest.  Cardioverted 1 time(s).  Cardioverted at 200J.  Evaluation Findings: Post procedure EKG shows: Sinus rhythm Complications: None Patient did tolerate procedure well.   Olga Millers 05/14/2023, 10:47 AM

## 2023-05-14 NOTE — H&P (Signed)
Office Visit 05/13/2023 [facility] at [facility], Roberts Gaudy, MD Cardiology Atrial fibrillation, unspecified type [facility]) Dx   Additional Documentation  Vitals: BP 138/90 Important    Pulse 75   Ht 5\' 9"  (1.753 m)   Wt 77.6 kg   SpO2 99%   BMI 25.25 kg/m   BSA 1.94 m  Flowsheets: NEWS,   MEWS Score,   Vital Signs,   Anthropometrics,   Data  Encounter Info: Billing Info,   History,   Allergies,   Detailed Report   All Notes   Progress Notes by Maurice Small, MD at 05/13/2023 1:30 PM  Author: Maurice Small, MD Author Type: Physician Filed: 05/13/2023  2:25 PM  Note Status: Signed Cosign: Cosign Not Required Encounter Date: 05/13/2023  Editor: Maurice Small, MD (Physician)             Expand All Collapse All  Electrophysiology Office Note:     Date:  05/13/2023    ID:  Bradley Dine, MD, DOB [birth year], MRN [id number]   PCP:  Leia Alf, MD (Inactive)              [facility] Providers Cardiologist:  None Electrophysiologist:  Sherryl Manges, MD      Referring MD: No ref. 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     Narrative History  He has a subcutaneous ICD.   He has been evaluated at [facility] for ablation. This was deferred for now since his burden has been low, and his episodes have been paroxysmal.       Device interrogation today shows 8 days of atrial fibrillation since may. The patient confirmed that he had an episode lasting about 3 days in May, and the current episode has been ongoing for about 5 days. The patient spoke with DOD, Dr.  Izora Ribas yesterday who advised cardioversion, which is scheduled for tomorrow. He is here for pre-procedural evaluation.   His symptoms are rather mild. He has elevated heart rate and is aware of his AF.     EKGs/Labs/Other Studies Reviewed Today:     Echocardiogram:   TTE 04/22/2021 EF 60-65%; mild concentric LVH     Monitors:     Stress testing:     Advanced imaging:     Cardiac catherization       EKG:   EKG Interpretation Date/Time:                  Thursday May 13 2023 13:45:52 EDT Ventricular Rate:         75 PR Interval:                   QRS Duration:             80 QT Interval:                 384 QTC Calculation:428 R Axis:                         14   Text Interpretation:Atrial fibrillation ST & T wave abnormality, consider  inferolateral ischemia When compared with ECG of 07-May-2021 09:43, Atrial fibrillation has replaced Sinus rhythm Confirmed by York Pellant [phone]) on 05/13/2023 1:58:09 PM        Physical Exam:     VS:  BP (!) 138/90   Pulse 75   Ht 5\' 9"  (1.753 m)   Wt 171 lb (77.6 kg)   SpO2 99%   BMI 25.25 kg/m         Wt Readings from Last 3 Encounters:  05/13/23 171 lb (77.6 kg)  03/09/23 169 lb (76.7 kg)  03/18/22 172 lb 6.4 oz (78.2 kg)      GEN: Well nourished, well developed in no acute distress CARDIAC: iRRR, no murmurs, rubs, gallops RESPIRATORY:  Normal work of breathing MUSCULOSKELETAL: no edema       ASSESSMENT & PLAN:      Paroxysmal atrial fibrillation Longest episode to-date is currently underway, lasting about 5 days He will follow-up with his EP at [facility] to reconsider rhythm control strategies   Apical HCM No evidence of heart failure No obstruction Icd in place with apical variant and presence of LGE   Secondary hypercoagulable state Continue apixaban 5       Signed, Maurice Small, MD  05/13/2023 2:02 PM    [facility]       For DCCV; compliant with apixaban; no  changes. Olga Millers

## 2023-05-14 NOTE — Anesthesia Postprocedure Evaluation (Signed)
Anesthesia Post Note  Patient: Bradley Dine, MD  Procedure(s) Performed: CARDIOVERSION     Patient location during evaluation: PACU Anesthesia Type: General Level of consciousness: awake and alert, oriented and patient cooperative Pain management: pain level controlled Vital Signs Assessment: post-procedure vital signs reviewed and stable Respiratory status: spontaneous breathing, nonlabored ventilation and respiratory function stable Cardiovascular status: blood pressure returned to baseline and stable Postop Assessment: no apparent nausea or vomiting Anesthetic complications: no   No notable events documented.  Last Vitals:  Vitals:   05/14/23 1120 05/14/23 1130  BP: 120/88 (!) 126/91  Pulse: (!) 52 (!) 50  Resp: 17 19  Temp:    SpO2: 98% 99%    Last Pain:  Vitals:   05/14/23 1104  TempSrc: Temporal  PainSc: 0-No pain                 Bradley Castaneda

## 2023-05-14 NOTE — Transfer of Care (Signed)
Immediate Anesthesia Transfer of Care Note  Patient: Bradley Dine, MD  Procedure(s) Performed: CARDIOVERSION  Patient Location: PACU  Anesthesia Type:General  Level of Consciousness: drowsy  Airway & Oxygen Therapy: Patient Spontanous Breathing and Patient connected to face mask oxygen  Post-op Assessment: Report given to RN and Post -op Vital signs reviewed and stable  Post vital signs: Reviewed and stable  Last Vitals:  Vitals Value Taken Time  BP    Temp    Pulse 60 05/14/23 1102  Resp 20 05/14/23 1102  SpO2 100 % 05/14/23 1102  Vitals shown include unfiled device data.  Last Pain:  Vitals:   05/14/23 1020  TempSrc: Temporal  PainSc: 0-No pain         Complications: No notable events documented.

## 2023-05-17 ENCOUNTER — Encounter (HOSPITAL_COMMUNITY): Payer: Self-pay | Admitting: Cardiology

## 2023-06-05 ENCOUNTER — Other Ambulatory Visit: Payer: Self-pay | Admitting: Internal Medicine

## 2023-06-05 ENCOUNTER — Other Ambulatory Visit (HOSPITAL_BASED_OUTPATIENT_CLINIC_OR_DEPARTMENT_OTHER): Payer: Self-pay

## 2023-06-05 DIAGNOSIS — I4891 Unspecified atrial fibrillation: Secondary | ICD-10-CM

## 2023-06-07 ENCOUNTER — Other Ambulatory Visit: Payer: Self-pay

## 2023-06-07 ENCOUNTER — Other Ambulatory Visit (HOSPITAL_BASED_OUTPATIENT_CLINIC_OR_DEPARTMENT_OTHER): Payer: Self-pay

## 2023-06-07 MED ORDER — APIXABAN 5 MG PO TABS
5.0000 mg | ORAL_TABLET | Freq: Two times a day (BID) | ORAL | 1 refills | Status: DC
  Filled 2023-06-07: qty 180, 90d supply, fill #0
  Filled 2023-09-11 (×2): qty 180, 90d supply, fill #1

## 2023-06-07 NOTE — Telephone Encounter (Signed)
Prescription refill request for Eliquis received. Indication:afib Last office visit:7/24 Scr:1.34  7/24 Age: 52 Weight:77.1  kg  Prescription refilled

## 2023-06-08 ENCOUNTER — Ambulatory Visit (INDEPENDENT_AMBULATORY_CARE_PROVIDER_SITE_OTHER): Payer: No Typology Code available for payment source

## 2023-06-08 ENCOUNTER — Other Ambulatory Visit (HOSPITAL_BASED_OUTPATIENT_CLINIC_OR_DEPARTMENT_OTHER): Payer: Self-pay

## 2023-06-08 DIAGNOSIS — I4891 Unspecified atrial fibrillation: Secondary | ICD-10-CM

## 2023-06-08 DIAGNOSIS — I422 Other hypertrophic cardiomyopathy: Secondary | ICD-10-CM | POA: Diagnosis not present

## 2023-06-08 LAB — CUP PACEART REMOTE DEVICE CHECK
Battery Remaining Percentage: 84 %
Date Time Interrogation Session: 20240820080000
HighPow Impedance: 65 Ohm
Implantable Lead Connection Status: 753985
Implantable Lead Implant Date: 20170927
Implantable Lead Location: 753862
Implantable Lead Model: 3401
Implantable Pulse Generator Implant Date: 20230130
Pulse Gen Serial Number: 171972

## 2023-06-09 ENCOUNTER — Encounter: Payer: Self-pay | Admitting: Internal Medicine

## 2023-06-18 NOTE — Progress Notes (Signed)
Remote ICD transmission.   

## 2023-06-22 DIAGNOSIS — I4891 Unspecified atrial fibrillation: Secondary | ICD-10-CM | POA: Diagnosis not present

## 2023-06-22 DIAGNOSIS — Z95810 Presence of automatic (implantable) cardiac defibrillator: Secondary | ICD-10-CM | POA: Diagnosis not present

## 2023-06-22 DIAGNOSIS — I422 Other hypertrophic cardiomyopathy: Secondary | ICD-10-CM | POA: Diagnosis not present

## 2023-06-22 DIAGNOSIS — I499 Cardiac arrhythmia, unspecified: Secondary | ICD-10-CM | POA: Diagnosis not present

## 2023-06-22 DIAGNOSIS — Z01818 Encounter for other preprocedural examination: Secondary | ICD-10-CM | POA: Diagnosis not present

## 2023-06-22 DIAGNOSIS — I472 Ventricular tachycardia, unspecified: Secondary | ICD-10-CM | POA: Diagnosis not present

## 2023-06-25 DIAGNOSIS — I480 Paroxysmal atrial fibrillation: Secondary | ICD-10-CM | POA: Diagnosis not present

## 2023-06-25 DIAGNOSIS — Z01810 Encounter for preprocedural cardiovascular examination: Secondary | ICD-10-CM | POA: Diagnosis not present

## 2023-06-25 DIAGNOSIS — I422 Other hypertrophic cardiomyopathy: Secondary | ICD-10-CM | POA: Diagnosis not present

## 2023-06-25 DIAGNOSIS — Z7901 Long term (current) use of anticoagulants: Secondary | ICD-10-CM | POA: Diagnosis not present

## 2023-06-25 DIAGNOSIS — I4811 Longstanding persistent atrial fibrillation: Secondary | ICD-10-CM | POA: Diagnosis not present

## 2023-06-28 DIAGNOSIS — R001 Bradycardia, unspecified: Secondary | ICD-10-CM | POA: Diagnosis not present

## 2023-06-28 DIAGNOSIS — I472 Ventricular tachycardia, unspecified: Secondary | ICD-10-CM | POA: Diagnosis not present

## 2023-06-28 DIAGNOSIS — I480 Paroxysmal atrial fibrillation: Secondary | ICD-10-CM | POA: Diagnosis not present

## 2023-06-28 DIAGNOSIS — R9431 Abnormal electrocardiogram [ECG] [EKG]: Secondary | ICD-10-CM | POA: Diagnosis not present

## 2023-06-28 DIAGNOSIS — I517 Cardiomegaly: Secondary | ICD-10-CM | POA: Diagnosis not present

## 2023-07-17 ENCOUNTER — Other Ambulatory Visit (HOSPITAL_BASED_OUTPATIENT_CLINIC_OR_DEPARTMENT_OTHER): Payer: Self-pay

## 2023-07-22 ENCOUNTER — Other Ambulatory Visit (HOSPITAL_BASED_OUTPATIENT_CLINIC_OR_DEPARTMENT_OTHER): Payer: Self-pay

## 2023-07-23 ENCOUNTER — Other Ambulatory Visit (HOSPITAL_BASED_OUTPATIENT_CLINIC_OR_DEPARTMENT_OTHER): Payer: Self-pay

## 2023-07-23 MED ORDER — COVID-19 MRNA VAC-TRIS(PFIZER) 30 MCG/0.3ML IM SUSY
0.3000 mL | PREFILLED_SYRINGE | Freq: Once | INTRAMUSCULAR | 0 refills | Status: AC
  Filled 2023-07-23: qty 0.3, 1d supply, fill #0

## 2023-09-11 ENCOUNTER — Other Ambulatory Visit (HOSPITAL_BASED_OUTPATIENT_CLINIC_OR_DEPARTMENT_OTHER): Payer: Self-pay

## 2023-09-13 DIAGNOSIS — Z8679 Personal history of other diseases of the circulatory system: Secondary | ICD-10-CM | POA: Diagnosis not present

## 2023-09-13 DIAGNOSIS — R079 Chest pain, unspecified: Secondary | ICD-10-CM | POA: Diagnosis not present

## 2023-09-13 DIAGNOSIS — Z9889 Other specified postprocedural states: Secondary | ICD-10-CM | POA: Diagnosis not present

## 2023-09-13 DIAGNOSIS — Z95810 Presence of automatic (implantable) cardiac defibrillator: Secondary | ICD-10-CM | POA: Diagnosis not present

## 2023-09-13 DIAGNOSIS — Z4502 Encounter for adjustment and management of automatic implantable cardiac defibrillator: Secondary | ICD-10-CM | POA: Diagnosis not present

## 2023-09-13 DIAGNOSIS — I472 Ventricular tachycardia, unspecified: Secondary | ICD-10-CM | POA: Diagnosis not present

## 2023-09-13 DIAGNOSIS — I422 Other hypertrophic cardiomyopathy: Secondary | ICD-10-CM | POA: Diagnosis not present

## 2023-09-13 DIAGNOSIS — Z7901 Long term (current) use of anticoagulants: Secondary | ICD-10-CM | POA: Diagnosis not present

## 2023-09-13 DIAGNOSIS — I499 Cardiac arrhythmia, unspecified: Secondary | ICD-10-CM | POA: Diagnosis not present

## 2023-09-13 DIAGNOSIS — I4891 Unspecified atrial fibrillation: Secondary | ICD-10-CM | POA: Diagnosis not present

## 2023-10-13 ENCOUNTER — Other Ambulatory Visit (HOSPITAL_BASED_OUTPATIENT_CLINIC_OR_DEPARTMENT_OTHER): Payer: Self-pay

## 2023-10-14 ENCOUNTER — Other Ambulatory Visit: Payer: Self-pay

## 2023-10-14 ENCOUNTER — Other Ambulatory Visit (HOSPITAL_BASED_OUTPATIENT_CLINIC_OR_DEPARTMENT_OTHER): Payer: Self-pay

## 2023-10-14 MED ORDER — ATORVASTATIN CALCIUM 20 MG PO TABS
20.0000 mg | ORAL_TABLET | Freq: Every day | ORAL | 1 refills | Status: AC
  Filled 2023-10-14: qty 90, 90d supply, fill #0
  Filled 2024-04-15: qty 90, 90d supply, fill #1

## 2023-10-15 ENCOUNTER — Other Ambulatory Visit (HOSPITAL_BASED_OUTPATIENT_CLINIC_OR_DEPARTMENT_OTHER): Payer: Self-pay

## 2023-10-15 ENCOUNTER — Other Ambulatory Visit (HOSPITAL_COMMUNITY): Payer: Self-pay

## 2023-10-15 MED ORDER — TOBRAMYCIN-DEXAMETHASONE 0.3-0.1 % OP SUSP
1.0000 [drp] | OPHTHALMIC | 0 refills | Status: AC
  Filled 2023-10-15: qty 5, 14d supply, fill #0

## 2023-10-15 MED ORDER — ERYTHROMYCIN 5 MG/GM OP OINT
1.0000 | TOPICAL_OINTMENT | Freq: Four times a day (QID) | OPHTHALMIC | 0 refills | Status: AC
  Filled 2023-10-15: qty 3.5, 7d supply, fill #0

## 2023-11-26 ENCOUNTER — Other Ambulatory Visit (HOSPITAL_COMMUNITY): Payer: Self-pay

## 2023-11-26 DIAGNOSIS — I480 Paroxysmal atrial fibrillation: Secondary | ICD-10-CM | POA: Diagnosis not present

## 2023-11-26 DIAGNOSIS — Z0000 Encounter for general adult medical examination without abnormal findings: Secondary | ICD-10-CM | POA: Diagnosis not present

## 2023-11-26 DIAGNOSIS — Z1211 Encounter for screening for malignant neoplasm of colon: Secondary | ICD-10-CM | POA: Diagnosis not present

## 2023-11-26 DIAGNOSIS — I422 Other hypertrophic cardiomyopathy: Secondary | ICD-10-CM | POA: Diagnosis not present

## 2023-11-26 DIAGNOSIS — Z125 Encounter for screening for malignant neoplasm of prostate: Secondary | ICD-10-CM | POA: Diagnosis not present

## 2023-11-26 DIAGNOSIS — E785 Hyperlipidemia, unspecified: Secondary | ICD-10-CM | POA: Diagnosis not present

## 2023-11-26 DIAGNOSIS — M542 Cervicalgia: Secondary | ICD-10-CM | POA: Diagnosis not present

## 2023-11-26 MED ORDER — ATORVASTATIN CALCIUM 20 MG PO TABS
20.0000 mg | ORAL_TABLET | Freq: Every day | ORAL | 5 refills | Status: AC
  Filled 2023-11-26 – 2024-01-08 (×2): qty 90, 90d supply, fill #0
  Filled 2024-07-18: qty 90, 90d supply, fill #1
  Filled 2024-10-17: qty 90, 90d supply, fill #2

## 2023-11-29 ENCOUNTER — Other Ambulatory Visit (HOSPITAL_BASED_OUTPATIENT_CLINIC_OR_DEPARTMENT_OTHER): Payer: Self-pay

## 2023-11-29 DIAGNOSIS — Z0000 Encounter for general adult medical examination without abnormal findings: Secondary | ICD-10-CM | POA: Diagnosis not present

## 2023-11-29 MED ORDER — PREVNAR 20 0.5 ML IM SUSY
0.5000 mL | PREFILLED_SYRINGE | Freq: Once | INTRAMUSCULAR | 0 refills | Status: AC
  Filled 2023-11-29 – 2024-02-07 (×2): qty 0.5, 1d supply, fill #0

## 2023-12-04 ENCOUNTER — Other Ambulatory Visit: Payer: Self-pay | Admitting: Internal Medicine

## 2023-12-04 DIAGNOSIS — I4891 Unspecified atrial fibrillation: Secondary | ICD-10-CM

## 2023-12-08 ENCOUNTER — Other Ambulatory Visit (HOSPITAL_BASED_OUTPATIENT_CLINIC_OR_DEPARTMENT_OTHER): Payer: Self-pay

## 2023-12-08 ENCOUNTER — Encounter (HOSPITAL_BASED_OUTPATIENT_CLINIC_OR_DEPARTMENT_OTHER): Payer: Self-pay

## 2023-12-09 ENCOUNTER — Other Ambulatory Visit: Payer: Self-pay | Admitting: Internal Medicine

## 2023-12-09 DIAGNOSIS — I4891 Unspecified atrial fibrillation: Secondary | ICD-10-CM

## 2023-12-10 NOTE — Telephone Encounter (Signed)
Prescription refill request for Eliquis received. Indication: Last office visit: Scr: Age:  Weight:

## 2023-12-11 ENCOUNTER — Other Ambulatory Visit (HOSPITAL_BASED_OUTPATIENT_CLINIC_OR_DEPARTMENT_OTHER): Payer: Self-pay

## 2023-12-13 ENCOUNTER — Other Ambulatory Visit (HOSPITAL_BASED_OUTPATIENT_CLINIC_OR_DEPARTMENT_OTHER): Payer: Self-pay

## 2023-12-14 ENCOUNTER — Other Ambulatory Visit (HOSPITAL_BASED_OUTPATIENT_CLINIC_OR_DEPARTMENT_OTHER): Payer: Self-pay

## 2023-12-15 ENCOUNTER — Other Ambulatory Visit (HOSPITAL_BASED_OUTPATIENT_CLINIC_OR_DEPARTMENT_OTHER): Payer: Self-pay

## 2023-12-15 ENCOUNTER — Ambulatory Visit (INDEPENDENT_AMBULATORY_CARE_PROVIDER_SITE_OTHER): Payer: No Typology Code available for payment source

## 2023-12-15 ENCOUNTER — Telehealth: Payer: Self-pay

## 2023-12-15 DIAGNOSIS — I422 Other hypertrophic cardiomyopathy: Secondary | ICD-10-CM

## 2023-12-15 DIAGNOSIS — I4891 Unspecified atrial fibrillation: Secondary | ICD-10-CM

## 2023-12-15 MED ORDER — APIXABAN 5 MG PO TABS
5.0000 mg | ORAL_TABLET | Freq: Two times a day (BID) | ORAL | 1 refills | Status: DC
  Filled 2023-12-15: qty 180, 90d supply, fill #0
  Filled 2024-03-11: qty 180, 90d supply, fill #1

## 2023-12-15 NOTE — Telephone Encounter (Signed)
 Per Dr Graciela Husbands, pt needs refill of Eliquis 5mg  - 1 tablet by mouth bid.

## 2023-12-16 ENCOUNTER — Other Ambulatory Visit (HOSPITAL_BASED_OUTPATIENT_CLINIC_OR_DEPARTMENT_OTHER): Payer: Self-pay

## 2023-12-16 LAB — CUP PACEART REMOTE DEVICE CHECK
Battery Remaining Percentage: 78 %
Date Time Interrogation Session: 20250226164200
HighPow Impedance: 60 Ohm
Implantable Lead Connection Status: 753985
Implantable Lead Implant Date: 20170927
Implantable Lead Location: 753862
Implantable Lead Model: 3401
Implantable Pulse Generator Implant Date: 20230130
Pulse Gen Serial Number: 171972

## 2023-12-28 ENCOUNTER — Ambulatory Visit (HOSPITAL_BASED_OUTPATIENT_CLINIC_OR_DEPARTMENT_OTHER): Payer: Commercial Managed Care - PPO | Attending: Internal Medicine | Admitting: Physical Therapy

## 2023-12-28 ENCOUNTER — Encounter (HOSPITAL_BASED_OUTPATIENT_CLINIC_OR_DEPARTMENT_OTHER): Payer: Self-pay | Admitting: Physical Therapy

## 2023-12-28 ENCOUNTER — Other Ambulatory Visit: Payer: Self-pay

## 2023-12-28 DIAGNOSIS — M5459 Other low back pain: Secondary | ICD-10-CM | POA: Diagnosis not present

## 2023-12-28 DIAGNOSIS — M542 Cervicalgia: Secondary | ICD-10-CM | POA: Diagnosis not present

## 2023-12-28 NOTE — Therapy (Signed)
 OUTPATIENT PHYSICAL THERAPY EVALUATION   Patient Name: Bradley VENN, MD MRN: [id number] DOB:1970-12-01, 53 y.o., male Today's Date: 12/28/2023  END OF SESSION:  PT End of Session - 12/28/23 0931     Visit Number 1    Number of Visits 17    Date for PT Re-Evaluation 02/22/24    Authorization Type [facility] employee    PT Start Time [id number]    PT Stop Time 1015    PT Time Calculation (min) 44 min    Activity Tolerance Patient tolerated treatment well             Past Medical History:  Diagnosis Date   Apical variant hypertrophic cardiomyopathy (HCC)    Elevated troponin    Hypercholesterolemia    NSVT (nonsustained ventricular tachycardia) (HCC)    Past Surgical History:  Procedure Laterality Date   CARDIOVERSION N/A 05/14/2023   Procedure: CARDIOVERSION;  Surgeon: Lewayne Bunting, MD;  Location: [facility] INVASIVE CV LAB;  Service: Cardiovascular;  Laterality: N/A;   EP IMPLANTABLE DEVICE N/A 07/15/2016   Procedure: SubQ ICD Implant;  Surgeon: Duke Salvia, MD;  Location: [facility] INVASIVE CV LAB;  Service: Cardiovascular;  Laterality: N/A;   KNEE SURGERY     SUBQ ICD CHANGEOUT N/A 11/17/2021   Procedure: SUBQ ICD CHANGEOUT;  Surgeon: Duke Salvia, MD;  Location: [facility] INVASIVE CV LAB;  Service: Cardiovascular;  Laterality: N/A;   Patient Active Problem List   Diagnosis Date Noted   Atrial fibrillation (HCC) 03/17/2022   ICD (implantable cardioverter-defibrillator) in place 10/22/2019   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) 07/15/2016   Shoulder pain 06/04/2016   Apical variant hypertrophic cardiomyopathy (HCC) 11/09/2015   AIVR (accelerated idioventricular rhythm) (HCC) 11/09/2015   Elevated troponin 11/09/2015   Chest pain 11/08/2015   NSVT (nonsustained ventricular tachycardia) (HCC)    Pain in the chest     PCP: Lorenda Ishihara, MD  REFERRING PROVIDER: Lorenda Ishihara, MD  REFERRING DIAG: M54.2 (ICD-10-CM) -  Cervicalgia  THERAPY DIAG:  Cervicalgia  Other low back pain  Rationale for Evaluation and Treatment: Rehabilitation  ONSET DATE: several years, worsening over past few years  SUBJECTIVE:                                                                                                                                                                                                         SUBJECTIVE STATEMENT: Pt endorses chronic history of neck/back pain that has fluctuated. States he has historically been quite active, including gymnastics when he was younger. Has  cardiac history as noted above, but has returned to full activities including resistance training, cardio, and golf.  He notes that over the past few years his neck pain has begun to bother him more without any particular change in activity or MOI. Tendency towards L sided tightness and catching when turning to the L, tends to have transient improvement with use of theragun. He denies any N/T but does endorse infrequent headaches which he feels may be related, are L sided.  He states his back has also bothered him over a similar timeline. He states he has had imaging which shows scoliosis, was given heel lifts to correct apparent leg length discrepancy which he notes may have been somewhat helpful. He has seen a chiropractor in the past with some relief.  Does not endorse any red flags today.  Hand dominance: Right  PERTINENT HISTORY:  hypertrophic cardiomyopathy, NSVT, cardioversion, ICD implant  PAIN:  Are you having pain: tightness in neck at rest, no resting pain in back Location/description: neck L sided tightness, feels a catch when turning ; BIL LBP Best-worst over past week: up to 5/10 for neck, 7/10 for back   - aggravating factors: lower body dressing, looking to the left, lying down, self distraction (back), golf (back), sitting >1 hr - Easing factors: lying on down on Rside, percussion gun  PRECAUTIONS: ICD  RED  FLAGS: None    WEIGHT BEARING RESTRICTIONS: No  FALLS:  Has patient fallen in last 6 months? No  LIVING ENVIRONMENT: Multilevel home, no issues with stairs, lives with family   OCCUPATION: MD - pediatric ICU  PLOF: Independent - enjoys playing golf  PATIENT GOALS: reduction of pain   NEXT MD VISIT: TBD  OBJECTIVE:  Note: Objective measures were completed at Evaluation unless otherwise noted.  DIAGNOSTIC FINDINGS:  No recent imaging in chart - pt reports imaging showing scoliosis 3-4 years ago   PATIENT SURVEYS:  NDI 10/50, 20%  COGNITION: Overall cognitive status: Within functional limits for tasks assessed  SENSATION: Does not endorse any sensory deficits  POSTURE: increased lumbar lordosis, anterior pelvic tilt  PALPATION: Concordant tightness throughout periscapular/lumbar musculature, nonpainful   CERVICAL ROM:   ROM A/PROM (deg) eval  Flexion full  Extension 50%  Right lateral flexion 25% pinch  Left lateral flexion 50% s  Right rotation 45 deg  Left rotation 50 deg   (Blank rows = not tested) (Key: WFL = within functional limits not formally assessed, * = concordant pain, s = stiffness/stretching sensation, NT = not tested) Comment:    LUMBAR ROM:    A/PROM  eval  Flexion Able to touch toes   Extension 100%  Right lateral flexion   Left lateral flexion   Right rotation 75% s  Left rotation 75%   (Blank rows = not tested) (Key: WFL = within functional limits not formally assessed, * = concordant pain, s = stiffness/stretching sensation, NT = not tested)  Comments:   RANGE OF MOTION:      Right eval Left eval  Shoulder flexion full Full w/ crepitus  Shoulder abduction 160  150 deg  Functional ER combo    Functional IR combo    Knee extension    Ankle dorsiflexion     (Blank rows = not tested) (Key: WFL = within functional limits not formally assessed, * = concordant pain, s = stiffness/stretching sensation, NT = not tested)   Comments:    STRENGTH TESTING:  MMT Right eval Left eval  Shoulder flexion 4*  4 *  Shoulder abduction 4 4  Elbow flexion    Elbow extension    Grip strength (gross)    Hip flexion 4 * 3+ *  Hip abduction (modified sitting) 5 5  Hip external rotation 4 4  Hip internal rotation 4 4  Knee flexion    Knee extension    Ankle dorsiflexion    Ankle plantarflexion     (Blank rows = not tested) (Key: WFL = within functional limits not formally assessed, * = concordant pain, s = stiffness/stretching sensation, NT = not tested)  Comments: of note, shoulder flex MMT elicits LBP    TREATMENT DATE:  [facility] Adult PT Treatment:                                                DATE: 12/28/23 Therapeutic Exercise: Red band double ER 3-3-3 tempo, standing w/ volitional core contraction x8 Standing march w/ UE support cues for core contraction and full/comfortable hip ROM x8 BIL HEP handout + education, relevant anatomy/physiology and rationale for interventions   PATIENT EDUCATION:  Education details: Pt education on PT impairments, prognosis, and POC. Informed consent. Rationale for interventions, safe/appropriate HEP performance Person educated: Patient Education method: Explanation, Demonstration, Tactile cues, Verbal cues Education comprehension: verbalized understanding, returned demonstration, verbal cues required, tactile cues required, and needs further education    HOME EXERCISE PROGRAM: Access Code: [email or web address] URL: [email or web address] Date: 12/28/2023 Prepared by: Fransisco Hertz  Program Notes - please perform 3-3-3 tempo with external rotation, as done in clinic- with march, please use upper body support for balance and maintain core engagement  Exercises - Shoulder External Rotation and Scapular Retraction with Resistance  - 2-3 x daily - 1 sets - 6-8 reps - Standing Hip Flexion March  - 2-3 x daily - 1 sets - 8-10 reps  ASSESSMENT:  CLINICAL  IMPRESSION: Patient is a 53 y.o. gentleman who was seen today for physical therapy evaluation and treatment for chronic neck/back pain. He denies overt limitations but does state over past couple years it has gradually begun to affect usual activities more notably. On exam he demonstrates concordant limitations in cervical, GH, and lumbar mobility, as well as mildly reduced strength relative to reported activity levels (enjoys sports, weight lifting, cardio). UE strength testing also reproduces LBP. No red flags noted today. Pt tolerates HEP/exam well with report of mild muscular fatigue as expected, no adverse events. Recommend trial of skilled PT to address aforementioned deficits with aim of improving functional tolerance and reducing pain with typical activities. Pt departs today's session in no acute distress, all voiced concerns/questions addressed appropriately from PT perspective.    OBJECTIVE IMPAIRMENTS: decreased activity tolerance, decreased endurance, decreased mobility, decreased ROM, decreased strength, postural dysfunction, and pain.   ACTIVITY LIMITATIONS: lifting, bending, sitting, standing, and sleeping  PARTICIPATION LIMITATIONS: community activity and occupation  PERSONAL FACTORS: Time since onset of injury/illness/exacerbation and 3+ comorbidities: cardiomyopathy, NSVT, ICD  are also affecting patient's functional outcome.   REHAB POTENTIAL: Good  CLINICAL DECISION MAKING: Evolving/moderate complexity  EVALUATION COMPLEXITY: Moderate   GOALS:  SHORT TERM GOALS: Target date: 01/25/2024  Pt will demonstrate appropriate understanding and performance of initially prescribed HEP in order to facilitate improved independence with management of symptoms.  Baseline: HEP established  Goal status: INITIAL   2. Pt will report at least 25% improvement in overall  pain levels over past week in order to facilitate improved tolerance to typical daily activities.   Baseline: up to 5/10  neck, 7/10 back  Goal status: INITIAL    LONG TERM GOALS: Target date: 02/22/2024  Pt will score less than or equal to 5% on NDI in order to demonstrate improved perception of function due to symptoms (MDC 10-13 pts per Loma Sender al 2009, 2010). Baseline: 20% Goal status: INITIAL  2. Pt will demonstrate bilateral cervical rotation AROM of at least 55 deg bilaterally in order to facilitate improved comfort w/ functional movements.  Baseline: see ROM chart above Goal status: INITIAL  3. Pt will demonstrate at least 4+/5 global shoulder/hip MMT for improved symmetry of UE strength and improved tolerance to functional movements.  Baseline: see MMT chart above Goal status: INITIAL   4. Pt will report/demonstrate ability to sit for greater than 2 hrs with less than 2 point increase on NPS in order to demonstrate improved tolerance to occupational/daily tasks. Baseline: increased pain w/ sitting > 1 hr Goal status: INITIAL   5. Pt will report at least 50% decrease in overall pain levels in past week in order to facilitate improved tolerance to basic ADLs/mobility.   Baseline: up to 5/10 neck, 7/10 back   Goal status: INITIAL    6. Pt will demonstrate appropriate performance of final prescribed HEP in order to facilitate improved self-management of symptoms post-discharge.   Baseline: initial HEP prescribed  Goal status: INITIAL     PLAN:  PT FREQUENCY: 1-2x/week  PT DURATION: 8 weeks  PLANNED INTERVENTIONS: [id number]- PT Re-evaluation, 97110-Therapeutic exercises, 97530- Therapeutic activity, 97112- Neuromuscular re-education, 97535- Self Care, [id number]- Manual therapy, Patient/Family education, Balance training, Stair training, Taping, Dry Needling, Joint mobilization, Spinal mobilization, Cryotherapy, and Moist heat  PLAN FOR NEXT SESSION: Review/update HEP PRN. Work on [employer] exercises as appropriate with emphasis on core stability, postural endurance, and motor control. Symptom  modification strategies as indicated/appropriate. Mindful of ICD and cardiac history.    Ashley Murrain PT, DPT 12/28/2023 2:00 PM

## 2023-12-29 DIAGNOSIS — I422 Other hypertrophic cardiomyopathy: Secondary | ICD-10-CM | POA: Diagnosis not present

## 2023-12-30 DIAGNOSIS — Z8679 Personal history of other diseases of the circulatory system: Secondary | ICD-10-CM | POA: Diagnosis not present

## 2023-12-30 DIAGNOSIS — R079 Chest pain, unspecified: Secondary | ICD-10-CM | POA: Diagnosis not present

## 2023-12-30 DIAGNOSIS — I480 Paroxysmal atrial fibrillation: Secondary | ICD-10-CM | POA: Diagnosis not present

## 2023-12-30 DIAGNOSIS — Z95810 Presence of automatic (implantable) cardiac defibrillator: Secondary | ICD-10-CM | POA: Diagnosis not present

## 2023-12-30 DIAGNOSIS — I422 Other hypertrophic cardiomyopathy: Secondary | ICD-10-CM | POA: Diagnosis not present

## 2023-12-30 DIAGNOSIS — Z7901 Long term (current) use of anticoagulants: Secondary | ICD-10-CM | POA: Diagnosis not present

## 2023-12-30 DIAGNOSIS — I499 Cardiac arrhythmia, unspecified: Secondary | ICD-10-CM | POA: Diagnosis not present

## 2023-12-30 DIAGNOSIS — I4891 Unspecified atrial fibrillation: Secondary | ICD-10-CM | POA: Diagnosis not present

## 2023-12-30 DIAGNOSIS — Z9889 Other specified postprocedural states: Secondary | ICD-10-CM | POA: Diagnosis not present

## 2023-12-30 DIAGNOSIS — I472 Ventricular tachycardia, unspecified: Secondary | ICD-10-CM | POA: Diagnosis not present

## 2023-12-30 DIAGNOSIS — Z4502 Encounter for adjustment and management of automatic implantable cardiac defibrillator: Secondary | ICD-10-CM | POA: Diagnosis not present

## 2024-01-04 ENCOUNTER — Encounter: Payer: Self-pay | Admitting: Internal Medicine

## 2024-01-08 ENCOUNTER — Other Ambulatory Visit (HOSPITAL_BASED_OUTPATIENT_CLINIC_OR_DEPARTMENT_OTHER): Payer: Self-pay

## 2024-01-11 ENCOUNTER — Encounter (HOSPITAL_BASED_OUTPATIENT_CLINIC_OR_DEPARTMENT_OTHER): Payer: Self-pay

## 2024-01-11 ENCOUNTER — Ambulatory Visit (HOSPITAL_BASED_OUTPATIENT_CLINIC_OR_DEPARTMENT_OTHER)

## 2024-01-11 DIAGNOSIS — M542 Cervicalgia: Secondary | ICD-10-CM

## 2024-01-11 DIAGNOSIS — M5459 Other low back pain: Secondary | ICD-10-CM | POA: Diagnosis not present

## 2024-01-11 NOTE — Therapy (Signed)
 OUTPATIENT PHYSICAL THERAPY TREATMENT   Patient Name: Bradley LEINBERGER, MD MRN: [id number] DOB:Jul 18, 1971, 53 y.o., male Today's Date: 01/11/2024  END OF SESSION:  PT End of Session - 01/11/24 1058     Visit Number 2    Number of Visits 17    Date for PT Re-Evaluation 02/22/24    Authorization Type [facility] employee    PT Start Time 1017    PT Stop Time 1103    PT Time Calculation (min) 46 min    Activity Tolerance Patient tolerated treatment well    Behavior During Therapy WFL for tasks assessed/performed              Past Medical History:  Diagnosis Date   Apical variant hypertrophic cardiomyopathy (HCC)    Elevated troponin    Hypercholesterolemia    NSVT (nonsustained ventricular tachycardia) (HCC)    Past Surgical History:  Procedure Laterality Date   CARDIOVERSION N/A 05/14/2023   Procedure: CARDIOVERSION;  Surgeon: Lewayne Bunting, MD;  Location: [facility] INVASIVE [facility];  Service: Cardiovascular;  Laterality: N/A;   EP IMPLANTABLE DEVICE N/A 07/15/2016   Procedure: SubQ ICD Implant;  Surgeon: Duke Salvia, MD;  Location: [facility] INVASIVE CV LAB;  Service: Cardiovascular;  Laterality: N/A;   KNEE SURGERY     SUBQ ICD CHANGEOUT N/A 11/17/2021   Procedure: SUBQ ICD CHANGEOUT;  Surgeon: Duke Salvia, MD;  Location: [facility] INVASIVE CV LAB;  Service: Cardiovascular;  Laterality: N/A;   Patient Active Problem List   Diagnosis Date Noted   Atrial fibrillation (HCC) 03/17/2022   ICD (implantable cardioverter-defibrillator) in place 10/22/2019   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) 07/15/2016   Shoulder pain 06/04/2016   Apical variant hypertrophic cardiomyopathy (HCC) 11/09/2015   AIVR (accelerated idioventricular rhythm) (HCC) 11/09/2015   Elevated troponin 11/09/2015   Chest pain 11/08/2015   NSVT (nonsustained ventricular tachycardia) (HCC)    Pain in the chest     PCP: Lorenda Ishihara, MD  REFERRING PROVIDER: Lorenda Ishihara, MD  REFERRING DIAG: M54.2 (ICD-10-CM) - Cervicalgia  THERAPY DIAG:  Other low back pain  Cervicalgia  Rationale for Evaluation and Treatment: Rehabilitation  ONSET DATE: several years, worsening over past few years  SUBJECTIVE:                                                                                                                                                                                                         SUBJECTIVE STATEMENT:  Pt reports constant neck pain/tightness only on L side.  Awaiting MD  referral for low back.   Eval: Pt endorses chronic history of neck/back pain that has fluctuated. States he has historically been quite active, including gymnastics when he was younger. Has cardiac history as noted above, but has returned to full activities including resistance training, cardio, and golf.  He notes that over the past few years his neck pain has begun to bother him more without any particular change in activity or MOI. Tendency towards L sided tightness and catching when turning to the L, tends to have transient improvement with use of theragun. He denies any N/T but does endorse infrequent headaches which he feels may be related, are L sided.  He states his back has also bothered him over a similar timeline. He states he has had imaging which shows scoliosis, was given heel lifts to correct apparent leg length discrepancy which he notes may have been somewhat helpful. He has seen a chiropractor in the past with some relief.  Does not endorse any red flags today.  Hand dominance: Right  PERTINENT HISTORY:  hypertrophic cardiomyopathy, NSVT, cardioversion, ICD implant  PAIN:  Are you having pain: tightness in neck at rest, no resting pain in back Location/description: neck L sided tightness, feels a catch when turning ; BIL LBP Best-worst over past week: up to 5/10 for neck, 7/10 for back   - aggravating factors: lower body dressing, looking to the  left, lying down, self distraction (back), golf (back), sitting >1 hr - Easing factors: lying on down on Rside, percussion gun  PRECAUTIONS: ICD  RED FLAGS: None    WEIGHT BEARING RESTRICTIONS: No  FALLS:  Has patient fallen in last 6 months? No  LIVING ENVIRONMENT: Multilevel home, no issues with stairs, lives with family   OCCUPATION: MD - pediatric ICU  PLOF: Independent - enjoys playing golf  PATIENT GOALS: reduction of pain   NEXT MD VISIT: TBD  OBJECTIVE:  Note: Objective measures were completed at Evaluation unless otherwise noted.  DIAGNOSTIC FINDINGS:  No recent imaging in chart - pt reports imaging showing scoliosis 3-4 years ago   PATIENT SURVEYS:  NDI 10/50, 20%  COGNITION: Overall cognitive status: Within functional limits for tasks assessed  SENSATION: Does not endorse any sensory deficits  POSTURE: increased lumbar lordosis, anterior pelvic tilt  PALPATION: Concordant tightness throughout periscapular/lumbar musculature, nonpainful   CERVICAL ROM:   ROM A/PROM (deg) eval  Flexion full  Extension 50%  Right lateral flexion 25% pinch  Left lateral flexion 50% s  Right rotation 45 deg  Left rotation 50 deg   (Blank rows = not tested) (Key: WFL = within functional limits not formally assessed, * = concordant pain, s = stiffness/stretching sensation, NT = not tested) Comment:    LUMBAR ROM:    A/PROM  eval  Flexion Able to touch toes   Extension 100%  Right lateral flexion   Left lateral flexion   Right rotation 75% s  Left rotation 75%   (Blank rows = not tested) (Key: WFL = within functional limits not formally assessed, * = concordant pain, s = stiffness/stretching sensation, NT = not tested)  Comments:   RANGE OF MOTION:      Right eval Left eval  Shoulder flexion full Full w/ crepitus  Shoulder abduction 160  150 deg  Functional ER combo    Functional IR combo    Knee extension    Ankle dorsiflexion     (Blank rows =  not tested) (Key: WFL = within functional limits not  formally assessed, * = concordant pain, s = stiffness/stretching sensation, NT = not tested)  Comments:    STRENGTH TESTING:  MMT Right eval Left eval  Shoulder flexion 4* 4 *  Shoulder abduction 4 4  Elbow flexion    Elbow extension    Grip strength (gross)    Hip flexion 4 * 3+ *  Hip abduction (modified sitting) 5 5  Hip external rotation 4 4  Hip internal rotation 4 4  Knee flexion    Knee extension    Ankle dorsiflexion    Ankle plantarflexion     (Blank rows = not tested) (Key: WFL = within functional limits not formally assessed, * = concordant pain, s = stiffness/stretching sensation, NT = not tested)  Comments: of note, shoulder flex MMT elicits LBP    TREATMENT DATE:   Treatment                            01/11/2024: Blank lines following charge title = not provided on this treatment date.   Manual:  TPDN No STM to cervical ps and upper traps, LS, sub occipitals Passive L LS stretching Cupping (static) to L UT There-ex: UT and LS stretching Cerivcal PROM Supine chin tucks 5" x10 Seated shoulder rolls Standing bil ER GTB x10 Standing H abduction GTB 2x10     [facility] Adult PT Treatment:                                                DATE: 12/28/23 Therapeutic Exercise: Red band double ER 3-3-3 tempo, standing w/ volitional core contraction x8 Standing march w/ UE support cues for core contraction and full/comfortable hip ROM x8 BIL HEP handout + education, relevant anatomy/physiology and rationale for interventions   PATIENT EDUCATION:  Education details: Pt education on PT impairments, prognosis, and POC. Informed consent. Rationale for interventions, safe/appropriate HEP performance Person educated: Patient Education method: Explanation, Demonstration, Tactile cues, Verbal cues Education comprehension: verbalized understanding, returned demonstration, verbal cues required, tactile cues required, and  needs further education    HOME EXERCISE PROGRAM: Access Code: [email or web address] URL: [email or web address] Date: 12/28/2023 Prepared by: Fransisco Hertz  Program Notes - please perform 3-3-3 tempo with external rotation, as done in clinic- with march, please use upper body support for balance and maintain core engagement  Exercises - Shoulder External Rotation and Scapular Retraction with Resistance  - 2-3 x daily - 1 sets - 6-8 reps - Standing Hip Flexion March  - 2-3 x daily - 1 sets - 8-10 reps  ASSESSMENT:  CLINICAL IMPRESSION:  Spent time on manual interventions to decrease restrictions in left upper traps, levator Scap, cervical paraspinals, and suboccipital muscles.  Patient responded well to manual and reported decreased tension following treatment.  Mild tension still present however. Good performance with postural re-ed exercises. Provided pt with green band to progress at home. Instructed in theracane use for his to purchase for home. Advised on body mechanics with gym exercises as pt was going to work out in gym following today's session. Will continue to progress as tolerated.  IE: Patient is a 53 y.o. gentleman who was seen today for physical therapy evaluation and treatment for chronic neck/back pain. He denies overt limitations but does state over past couple years it has gradually begun to affect usual activities more  notably. On exam he demonstrates concordant limitations in cervical, GH, and lumbar mobility, as well as mildly reduced strength relative to reported activity levels (enjoys sports, weight lifting, cardio). UE strength testing also reproduces LBP. No red flags noted today. Pt tolerates HEP/exam well with report of mild muscular fatigue as expected, no adverse events. Recommend trial of skilled PT to address aforementioned deficits with aim of improving functional tolerance and reducing pain with typical activities. Pt departs today's session in no acute  distress, all voiced concerns/questions addressed appropriately from PT perspective.    OBJECTIVE IMPAIRMENTS: decreased activity tolerance, decreased endurance, decreased mobility, decreased ROM, decreased strength, postural dysfunction, and pain.   ACTIVITY LIMITATIONS: lifting, bending, sitting, standing, and sleeping  PARTICIPATION LIMITATIONS: community activity and occupation  PERSONAL FACTORS: Time since onset of injury/illness/exacerbation and 3+ comorbidities: cardiomyopathy, NSVT, ICD  are also affecting patient's functional outcome.   REHAB POTENTIAL: Good  CLINICAL DECISION MAKING: Evolving/moderate complexity  EVALUATION COMPLEXITY: Moderate   GOALS:  SHORT TERM GOALS: Target date: 01/25/2024  Pt will demonstrate appropriate understanding and performance of initially prescribed HEP in order to facilitate improved independence with management of symptoms.  Baseline: HEP established  Goal status: INITIAL   2. Pt will report at least 25% improvement in overall pain levels over past week in order to facilitate improved tolerance to typical daily activities.   Baseline: up to 5/10 neck, 7/10 back  Goal status: INITIAL    LONG TERM GOALS: Target date: 02/22/2024  Pt will score less than or equal to 5% on NDI in order to demonstrate improved perception of function due to symptoms (MDC 10-13 pts per Loma Sender al 2009, 2010). Baseline: 20% Goal status: INITIAL  2. Pt will demonstrate bilateral cervical rotation AROM of at least 55 deg bilaterally in order to facilitate improved comfort w/ functional movements.  Baseline: see ROM chart above Goal status: INITIAL  3. Pt will demonstrate at least 4+/5 global shoulder/hip MMT for improved symmetry of UE strength and improved tolerance to functional movements.  Baseline: see MMT chart above Goal status: INITIAL   4. Pt will report/demonstrate ability to sit for greater than 2 hrs with less than 2 point increase on NPS in order to  demonstrate improved tolerance to occupational/daily tasks. Baseline: increased pain w/ sitting > 1 hr Goal status: INITIAL   5. Pt will report at least 50% decrease in overall pain levels in past week in order to facilitate improved tolerance to basic ADLs/mobility.   Baseline: up to 5/10 neck, 7/10 back   Goal status: INITIAL    6. Pt will demonstrate appropriate performance of final prescribed HEP in order to facilitate improved self-management of symptoms post-discharge.   Baseline: initial HEP prescribed  Goal status: INITIAL     PLAN:  PT FREQUENCY: 1-2x/week  PT DURATION: 8 weeks  PLANNED INTERVENTIONS: 97164- PT Re-evaluation, 97110-Therapeutic exercises, 97530- Therapeutic activity, 97112- Neuromuscular re-education, 97535- Self Care, 82956- Manual therapy, Patient/Family education, Balance training, Stair training, Taping, Dry Needling, Joint mobilization, Spinal mobilization, Cryotherapy, and Moist heat  PLAN FOR NEXT SESSION: Review/update HEP PRN. Work on [employer] exercises as appropriate with emphasis on core stability, postural endurance, and motor control. Symptom modification strategies as indicated/appropriate. Mindful of ICD and cardiac history.     Donnel Saxon Shrihan Putt, PTA 01/11/2024, 1:00 PM

## 2024-01-18 ENCOUNTER — Encounter: Payer: Self-pay | Admitting: Internal Medicine

## 2024-01-18 NOTE — Addendum Note (Signed)
 Addended by: Elease Etienne A on: 01/18/2024 01:13 PM   Modules accepted: Orders

## 2024-01-18 NOTE — Progress Notes (Signed)
 Remote ICD transmission.

## 2024-01-21 ENCOUNTER — Other Ambulatory Visit (HOSPITAL_BASED_OUTPATIENT_CLINIC_OR_DEPARTMENT_OTHER): Payer: Self-pay

## 2024-01-22 ENCOUNTER — Encounter (HOSPITAL_BASED_OUTPATIENT_CLINIC_OR_DEPARTMENT_OTHER): Payer: Self-pay | Admitting: Physical Therapy

## 2024-01-22 ENCOUNTER — Ambulatory Visit (HOSPITAL_BASED_OUTPATIENT_CLINIC_OR_DEPARTMENT_OTHER): Attending: Internal Medicine | Admitting: Physical Therapy

## 2024-01-22 ENCOUNTER — Other Ambulatory Visit: Payer: Self-pay

## 2024-01-22 DIAGNOSIS — M5459 Other low back pain: Secondary | ICD-10-CM | POA: Diagnosis present

## 2024-01-22 DIAGNOSIS — M542 Cervicalgia: Secondary | ICD-10-CM | POA: Insufficient documentation

## 2024-01-22 NOTE — Therapy (Signed)
 OUTPATIENT PHYSICAL THERAPY Re-EVAL    Patient Name: Bradley LONDO, MD MRN: [id number] DOB:[birth year], 53 y.o., male Today's Date: 01/22/2024  END OF SESSION:     Past Medical History:  Diagnosis Date   Apical variant hypertrophic cardiomyopathy (HCC)    Elevated troponin    Hypercholesterolemia    NSVT (nonsustained ventricular tachycardia) (HCC)    Past Surgical History:  Procedure Laterality Date   CARDIOVERSION N/A 05/14/2023   Procedure: CARDIOVERSION;  Surgeon: Lewayne Bunting, MD;  Location: [facility] INVASIVE CV LAB;  Service: Cardiovascular;  Laterality: N/A;   EP IMPLANTABLE DEVICE N/A 07/15/2016   Procedure: SubQ ICD Implant;  Surgeon: Duke Salvia, MD;  Location: [facility] INVASIVE CV LAB;  Service: Cardiovascular;  Laterality: N/A;   KNEE SURGERY     SUBQ ICD CHANGEOUT N/A 11/17/2021   Procedure: SUBQ ICD CHANGEOUT;  Surgeon: Duke Salvia, MD;  Location: [facility] INVASIVE CV LAB;  Service: Cardiovascular;  Laterality: N/A;   Patient Active Problem List   Diagnosis Date Noted   Atrial fibrillation (HCC) 03/17/2022   ICD (implantable cardioverter-defibrillator) in place 10/22/2019   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) 07/15/2016   Shoulder pain 06/04/2016   Apical variant hypertrophic cardiomyopathy (HCC) 11/09/2015   AIVR (accelerated idioventricular rhythm) (HCC) 11/09/2015   Elevated troponin 11/09/2015   Chest pain 11/08/2015   NSVT (nonsustained ventricular tachycardia) (HCC)    Pain in the chest     PCP: Lorenda Ishihara, MD  REFERRING PROVIDER: Lorenda Ishihara, MD  REFERRING DIAG: M54.2 (ICD-10-CM) - Cervicalgia Dorsalgia, unspecified [M54.9]   THERAPY DIAG:  Other low back pain - Plan: PT plan of care cert/re-cert  Cervicalgia - Plan: PT plan of care cert/re-cert  Rationale for Evaluation and Treatment: Rehabilitation  ONSET DATE: several years, worsening over past few years  SUBJECTIVE:                                                                                                                                                                                                          SUBJECTIVE STATEMENT:  Pt endorses eval notes about history of lower back pain. He states that he continues to note pain in both his lower back and neck. Theracane has been helping to alleviate some pain.    Eval: Pt endorses chronic history of neck/back pain that has fluctuated. States he has historically been quite active, including gymnastics when he was younger. Has cardiac history as noted above, but has returned to full activities including resistance training, cardio, and golf.  He notes  that over the past few years his neck pain has begun to bother him more without any particular change in activity or MOI. Tendency towards L sided tightness and catching when turning to the L, tends to have transient improvement with use of theragun. He denies any N/T but does endorse infrequent headaches which he feels may be related, are L sided.  He states his back has also bothered him over a similar timeline. He states he has had imaging which shows scoliosis, was given heel lifts to correct apparent leg length discrepancy which he notes may have been somewhat helpful. He has seen a chiropractor in the past with some relief.  Does not endorse any red flags today.  Hand dominance: Right  PERTINENT HISTORY:  hypertrophic cardiomyopathy, NSVT, cardioversion, ICD implant  PAIN:  Are you having pain: tightness in neck at rest, no resting pain in back Location/description: neck L sided tightness, feels a catch when turning ; BIL LBP Best-worst over past week: up to 5/10 for neck, 7/10 for back   - aggravating factors: lower body dressing, looking to the left, lying down, self distraction (back), golf (back), sitting >1 hr - Easing factors: lying on down on Rside, percussion gun  PRECAUTIONS: ICD  RED  FLAGS: None    WEIGHT BEARING RESTRICTIONS: No  FALLS:  Has patient fallen in last 6 months? No  LIVING ENVIRONMENT: Multilevel home, no issues with stairs, lives with family   OCCUPATION: MD - pediatric ICU  PLOF: Independent - enjoys playing golf  PATIENT GOALS: reduction of pain   NEXT MD VISIT: TBD  OBJECTIVE:  Note: Objective measures were completed at Evaluation unless otherwise noted.  DIAGNOSTIC FINDINGS:  No recent imaging in chart - pt reports imaging showing scoliosis 3-4 years ago   PATIENT SURVEYS:  NDI 10/50, 20%  COGNITION: Overall cognitive status: Within functional limits for tasks assessed  SENSATION: Does not endorse any sensory deficits  POSTURE: increased lumbar lordosis, anterior pelvic tilt  PALPATION: Concordant tightness throughout periscapular/lumbar musculature, nonpainful   CERVICAL ROM:   ROM A/PROM (deg) eval  Flexion full  Extension 50%  Right lateral flexion 25% pinch  Left lateral flexion 50% s  Right rotation 45 deg  Left rotation 50 deg   (Blank rows = not tested) (Key: WFL = within functional limits not formally assessed, * = concordant pain, s = stiffness/stretching sensation, NT = not tested) Comment:    LUMBAR ROM:    A/PROM  eval A/PROM re-eval 04/05   Flexion Able to touch toes  Able to touch toes   Extension 100% 100%  Right lateral flexion    Left lateral flexion    Right rotation 75% s 100%   Left rotation 75% 100%    (Blank rows = not tested) (Key: WFL = within functional limits not formally assessed, * = concordant pain, s = stiffness/stretching sensation, NT = not tested)  Comments:   RANGE OF MOTION:      Right eval Left eval  Shoulder flexion full Full w/ crepitus  Shoulder abduction 160  150 deg  Functional ER combo    Functional IR combo    Knee extension    Ankle dorsiflexion     (Blank rows = not tested) (Key: WFL = within functional limits not formally assessed, * = concordant pain,  s = stiffness/stretching sensation, NT = not tested)  Comments:    STRENGTH TESTING:  MMT Right eval Left eval R/L  04/05  Shoulder flexion 4* 4 *  Shoulder abduction 4 4   Elbow flexion     Elbow extension     Grip strength (gross)     Hip flexion 4 * 3+ *   Hip abduction (modified sitting) 5 5 4+/4- in sidelying   Hip external rotation 4 4   Hip internal rotation 4 4   Knee flexion     Knee extension     Ankle dorsiflexion     Ankle plantarflexion      (Blank rows = not tested) (Key: WFL = within functional limits not formally assessed, * = concordant pain, s = stiffness/stretching sensation, NT = not tested)  Comments: of note, shoulder flex MMT elicits LBP    TREATMENT DATE:  Treatment                            01/22/2024: Blank lines following charge title = not provided on this treatment date.   Manual:  Trigger Point Dry Needling  Initial Treatment: Pt instructed on Dry Needling rational, procedures, and possible side effects. Pt instructed to expect mild to moderate muscle soreness later in the day and/or into the next day.  Pt instructed in methods to reduce muscle soreness. Pt instructed to continue prescribed HEP.  Patient Verbal Consent Given: Yes Education Handout Provided: No Pt was informed with verbal instruction and consent.  Muscles Treated: L UT Electrical Stimulation Performed: Yes, Parameters: low amplitude, 50 freq Treatment Response/Outcome: Reduced tension, improved muscle elasticity.  There-ex:  Neuro:  Ball rolls against wall with focus on glute med activation on stabilizing leg.  Sidelying clams with GTB in two positions. Feet together, and reverse  I, T,Y in prone with focus on technique  Education:   Dry needling, muscle length/ tension how to focus on stretching dynamic/ static, scoliosis and anatomical changes, focus on RTC muscles and hip stabilizers. Ice/ heat. S&S.  Re- eval.   Treatment                             01/11/2024: Blank lines following charge title = not provided on this treatment date.   Manual:  TPDN No STM to cervical ps and upper traps, LS, sub occipitals Passive L LS stretching Cupping (static) to L UT There-ex: UT and LS stretching Cerivcal PROM Supine chin tucks 5" x10 Seated shoulder rolls Standing bil ER GTB x10 Standing H abduction GTB 2x10     [facility] Adult PT Treatment:                                                DATE: 12/28/23 Therapeutic Exercise: Red band double ER 3-3-3 tempo, standing w/ volitional core contraction x8 Standing march w/ UE support cues for core contraction and full/comfortable hip ROM x8 BIL HEP handout + education, relevant anatomy/physiology and rationale for interventions   PATIENT EDUCATION:  Education details: Pt education on PT impairments, prognosis, and POC. Informed consent. Rationale for interventions, safe/appropriate HEP performance Person educated: Patient Education method: Explanation, Demonstration, Tactile cues, Verbal cues Education comprehension: verbalized understanding, returned demonstration, verbal cues required, tactile cues required, and needs further education    HOME EXERCISE PROGRAM: Access Code: [email or web address] URL: [email or web address] Date: 12/28/2023 Prepared by: Fransisco Hertz  Program Notes - please perform 3-3-3 tempo with external rotation, as  done in clinic- with march, please use upper body support for balance and maintain core engagement  Exercises - Shoulder External Rotation and Scapular Retraction with Resistance  - 2-3 x daily - 1 sets - 6-8 reps - Standing Hip Flexion March  - 2-3 x daily - 1 sets - 8-10 reps  ASSESSMENT:  CLINICAL IMPRESSION: Spent a lot of time with education today about affects of scoliosis, muscle tension and overuse. Discussed new techniques and exercises to help alleviate overuse of muscles and recruit smaller muscles. Pt agreed to [facility] with electrical stimulation  today with good response. Pt had a very active response to the electrical stimulation with low amplitude, but reports no adverse effects. Discussed precautions. Provided updated comprehensive HEP with focus on glute med due to weakness noted and reported. Pt responded well to session today and departs with improved symptoms. Pt will continue to benefit from skilled PT to address continued deficits.   Will continue to progress as tolerated.  IE: Patient is a 53 y.o. gentleman who was seen today for physical therapy evaluation and treatment for chronic neck/back pain. He denies overt limitations but does state over past couple years it has gradually begun to affect usual activities more notably. On exam he demonstrates concordant limitations in cervical, GH, and lumbar mobility, as well as mildly reduced strength relative to reported activity levels (enjoys sports, weight lifting, cardio). UE strength testing also reproduces LBP. No red flags noted today. Pt tolerates HEP/exam well with report of mild muscular fatigue as expected, no adverse events. Recommend trial of skilled PT to address aforementioned deficits with aim of improving functional tolerance and reducing pain with typical activities. Pt departs today's session in no acute distress, all voiced concerns/questions addressed appropriately from PT perspective.    OBJECTIVE IMPAIRMENTS: decreased activity tolerance, decreased endurance, decreased mobility, decreased ROM, decreased strength, postural dysfunction, and pain.   ACTIVITY LIMITATIONS: lifting, bending, sitting, standing, and sleeping  PARTICIPATION LIMITATIONS: community activity and occupation  PERSONAL FACTORS: Time since onset of injury/illness/exacerbation and 3+ comorbidities: cardiomyopathy, NSVT, ICD  are also affecting patient's functional outcome.   REHAB POTENTIAL: Good  CLINICAL DECISION MAKING: Evolving/moderate complexity  EVALUATION COMPLEXITY:  Moderate   GOALS:  SHORT TERM GOALS: Target date: 01/25/2024  Pt will demonstrate appropriate understanding and performance of initially prescribed HEP in order to facilitate improved independence with management of symptoms.  Baseline: HEP established  Goal status: INITIAL   2. Pt will report at least 25% improvement in overall pain levels over past week in order to facilitate improved tolerance to typical daily activities.   Baseline: up to 5/10 neck, 7/10 back  Goal status: INITIAL    LONG TERM GOALS: Target date: 02/22/2024  Pt will score less than or equal to 5% on NDI in order to demonstrate improved perception of function due to symptoms (MDC 10-13 pts per Loma Sender al 2009, 2010). Baseline: 20% Goal status: INITIAL  2. Pt will demonstrate bilateral cervical rotation AROM of at least 55 deg bilaterally in order to facilitate improved comfort w/ functional movements.  Baseline: see ROM chart above Goal status: INITIAL  3. Pt will demonstrate at least 4+/5 global shoulder/hip MMT for improved symmetry of UE strength and improved tolerance to functional movements.  Baseline: see MMT chart above Goal status: INITIAL   4. Pt will report/demonstrate ability to sit for greater than 2 hrs with less than 2 point increase on NPS in order to demonstrate improved tolerance to occupational/daily tasks. Baseline: increased  pain w/ sitting > 1 hr Goal status: INITIAL   5. Pt will report at least 50% decrease in overall pain levels in past week in order to facilitate improved tolerance to basic ADLs/mobility.   Baseline: up to 5/10 neck, 7/10 back   Goal status: INITIAL    6. Pt will demonstrate appropriate performance of final prescribed HEP in order to facilitate improved self-management of symptoms post-discharge.   Baseline: initial HEP prescribed  Goal status: INITIAL     PLAN:  PT FREQUENCY: 1-2x/week  PT DURATION: 8 weeks  PLANNED INTERVENTIONS: 97164- PT Re-evaluation,  97110-Therapeutic exercises, 97530- Therapeutic activity, 97112- Neuromuscular re-education, 97535- Self Care, [id number]- Manual therapy, Patient/Family education, Balance training, Stair training, Taping, Dry Needling, Joint mobilization, Spinal mobilization, Cryotherapy, and Moist heat  PLAN FOR NEXT SESSION: Review/update HEP PRN. Work on [employer] exercises as appropriate with emphasis on core stability, postural endurance, and motor control. Symptom modification strategies as indicated/appropriate. Mindful of ICD and cardiac history.     Champ Mungo, PT 01/22/2024, 9:48 AM

## 2024-01-22 NOTE — Patient Instructions (Signed)

## 2024-02-07 ENCOUNTER — Encounter (HOSPITAL_BASED_OUTPATIENT_CLINIC_OR_DEPARTMENT_OTHER): Payer: Self-pay | Admitting: Physical Therapy

## 2024-02-07 ENCOUNTER — Other Ambulatory Visit (HOSPITAL_BASED_OUTPATIENT_CLINIC_OR_DEPARTMENT_OTHER): Payer: Self-pay

## 2024-02-07 ENCOUNTER — Ambulatory Visit (HOSPITAL_BASED_OUTPATIENT_CLINIC_OR_DEPARTMENT_OTHER): Admitting: Physical Therapy

## 2024-02-07 DIAGNOSIS — M542 Cervicalgia: Secondary | ICD-10-CM

## 2024-02-07 DIAGNOSIS — M5459 Other low back pain: Secondary | ICD-10-CM | POA: Diagnosis not present

## 2024-02-07 NOTE — Therapy (Signed)
 OUTPATIENT PHYSICAL THERAPY Re-EVAL    Patient Name: Bradley HAYDEL, MD MRN: [id number] DOB:1970-10-31, 53 y.o., male Today's Date: 02/07/2024  END OF SESSION:  PT End of Session - 02/07/24 0857     Visit Number 4    Number of Visits 17    Date for PT Re-Evaluation 02/22/24    Authorization Type [facility] employee    PT Start Time [phone]    PT Stop Time 0930    PT Time Calculation (min) 43 min    Activity Tolerance Patient tolerated treatment well;No increased pain    Behavior During Therapy WFL for tasks assessed/performed               Past Medical History:  Diagnosis Date   Apical variant hypertrophic cardiomyopathy (HCC)    Elevated troponin    Hypercholesterolemia    NSVT (nonsustained ventricular tachycardia) (HCC)    Past Surgical History:  Procedure Laterality Date   CARDIOVERSION N/A 05/14/2023   Procedure: CARDIOVERSION;  Surgeon: Lenise Quince, MD;  Location: [facility] INVASIVE CV LAB;  Service: Cardiovascular;  Laterality: N/A;   EP IMPLANTABLE DEVICE N/A 07/15/2016   Procedure: SubQ ICD Implant;  Surgeon: Verona Goodwill, MD;  Location: [facility] INVASIVE CV LAB;  Service: Cardiovascular;  Laterality: N/A;   KNEE SURGERY     SUBQ ICD CHANGEOUT N/A 11/17/2021   Procedure: SUBQ ICD CHANGEOUT;  Surgeon: Verona Goodwill, MD;  Location: [facility] INVASIVE CV LAB;  Service: Cardiovascular;  Laterality: N/A;   Patient Active Problem List   Diagnosis Date Noted   Atrial fibrillation (HCC) 03/17/2022   ICD (implantable cardioverter-defibrillator) in place 10/22/2019   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) 07/15/2016   Shoulder pain 06/04/2016   Apical variant hypertrophic cardiomyopathy (HCC) 11/09/2015   AIVR (accelerated idioventricular rhythm) (HCC) 11/09/2015   Elevated troponin 11/09/2015   Chest pain 11/08/2015   NSVT (nonsustained ventricular tachycardia) (HCC)    Pain in the chest     PCP: Arva Lathe, MD  REFERRING PROVIDER:  Arva Lathe, MD  REFERRING DIAG: M54.2 (ICD-10-CM) - Cervicalgia Dorsalgia, unspecified [M54.9]   THERAPY DIAG:  Other low back pain  Cervicalgia  Rationale for Evaluation and Treatment: Rehabilitation  ONSET DATE: several years, worsening over past few years  SUBJECTIVE:                                                                                                                                                                                                         SUBJECTIVE STATEMENT: 4/21 Pt states the back  has been better but the neck is still bothering him. Still feels like it locks and is painful when turning to the left and back.    Eval: Pt endorses chronic history of neck/back pain that has fluctuated. States he has historically been quite active, including gymnastics when he was younger. Has cardiac history as noted above, but has returned to full activities including resistance training, cardio, and golf.  He notes that over the past few years his neck pain has begun to bother him more without any particular change in activity or MOI. Tendency towards L sided tightness and catching when turning to the L, tends to have transient improvement with use of theragun. He denies any N/T but does endorse infrequent headaches which he feels may be related, are L sided.  He states his back has also bothered him over a similar timeline. He states he has had imaging which shows scoliosis, was given heel lifts to correct apparent leg length discrepancy which he notes may have been somewhat helpful. He has seen a chiropractor in the past with some relief.  Does not endorse any red flags today.  Hand dominance: Right  PERTINENT HISTORY:  hypertrophic cardiomyopathy, NSVT, cardioversion, ICD implant  PAIN:  Are you having pain: tightness in neck at rest, no resting pain in back Location/description: neck L sided tightness, feels a catch when turning ; BIL LBP Best-worst over  past week: up to 5/10 for neck, 7/10 for back   - aggravating factors: lower body dressing, looking to the left, lying down, self distraction (back), golf (back), sitting >1 hr - Easing factors: lying on down on Rside, percussion gun  PRECAUTIONS: ICD  RED FLAGS: None    WEIGHT BEARING RESTRICTIONS: No  FALLS:  Has patient fallen in last 6 months? No  LIVING ENVIRONMENT: Multilevel home, no issues with stairs, lives with family   OCCUPATION: MD - pediatric ICU  PLOF: Independent - enjoys playing golf  PATIENT GOALS: reduction of pain   NEXT MD VISIT: TBD  OBJECTIVE:  Note: Objective measures were completed at Evaluation unless otherwise noted.  DIAGNOSTIC FINDINGS:  No recent imaging in chart - pt reports imaging showing scoliosis 3-4 years ago   PATIENT SURVEYS:  NDI 10/50, 20%  COGNITION: Overall cognitive status: Within functional limits for tasks assessed  SENSATION: Does not endorse any sensory deficits  POSTURE: increased lumbar lordosis, anterior pelvic tilt  PALPATION: Concordant tightness throughout periscapular/lumbar musculature, nonpainful   CERVICAL ROM:   ROM A/PROM (deg) eval  Flexion full  Extension 50%  Right lateral flexion 25% pinch  Left lateral flexion 50% s  Right rotation 45 deg  Left rotation 50 deg   (Blank rows = not tested) (Key: WFL = within functional limits not formally assessed, * = concordant pain, s = stiffness/stretching sensation, NT = not tested) Comment:    LUMBAR ROM:    A/PROM  eval A/PROM re-eval 04/05   Flexion Able to touch toes  Able to touch toes   Extension 100% 100%  Right lateral flexion    Left lateral flexion    Right rotation 75% s 100%   Left rotation 75% 100%    (Blank rows = not tested) (Key: WFL = within functional limits not formally assessed, * = concordant pain, s = stiffness/stretching sensation, NT = not tested)  Comments:   RANGE OF MOTION:      Right eval Left eval  Shoulder  flexion full Full w/ crepitus  Shoulder abduction 160  150 deg  Functional ER combo    Functional IR combo    Knee extension    Ankle dorsiflexion     (Blank rows = not tested) (Key: WFL = within functional limits not formally assessed, * = concordant pain, s = stiffness/stretching sensation, NT = not tested)  Comments:    STRENGTH TESTING:  MMT Right eval Left eval R/L  04/05  Shoulder flexion 4* 4 *   Shoulder abduction 4 4   Elbow flexion     Elbow extension     Grip strength (gross)     Hip flexion 4 * 3+ *   Hip abduction (modified sitting) 5 5 4+/4- in sidelying   Hip external rotation 4 4   Hip internal rotation 4 4   Knee flexion     Knee extension     Ankle dorsiflexion     Ankle plantarflexion      (Blank rows = not tested) (Key: WFL = within functional limits not formally assessed, * = concordant pain, s = stiffness/stretching sensation, NT = not tested)  Comments: of note, shoulder flex MMT elicits LBP    TREATMENT DATE:  4/21 Manual: All PROM performed with distraction to reduce pain and improve movement Trigger Point Dry Needling  Subsequent Treatment: Instructions provided previously at initial dry needling treatment.  Instructions reviewed, if requested by the patient, prior to subsequent dry needling treatment.   Patient Verbal Consent Given: Yes Education Handout Provided: Previously Provided Muscles Treated: L UT, left cervical 3x  Electrical Stimulation Performed: No Treatment Response/Outcome: muscle twich response  STM and trigger point therapy to suboccipitals, UT, paraspinals  There-ex: Cervical SNAG ext and rotn Pt education on exercise grading and weight progression Neuro-Re-ed Seated cable row 40lbs 2x10 (pt education provided on posture and abdominal bracing)  Treatment                            01/22/2024: Blank lines following charge title = not provided on this treatment date.   Manual:  Trigger Point  Dry Needling  Initial Treatment: Pt instructed on Dry Needling rational, procedures, and possible side effects. Pt instructed to expect mild to moderate muscle soreness later in the day and/or into the next day.  Pt instructed in methods to reduce muscle soreness. Pt instructed to continue prescribed HEP.  Patient Verbal Consent Given: Yes Education Handout Provided: No Pt was informed with verbal instruction and consent.  Muscles Treated: L UT Electrical Stimulation Performed: Yes, Parameters: low amplitude, 50 freq Treatment Response/Outcome: Reduced tension, improved muscle elasticity.  There-ex:  Neuro:  Ball rolls against wall with focus on glute med activation on stabilizing leg.  Sidelying clams with GTB in two positions. Feet together, and reverse  I, T,Y in prone with focus on technique  Education:   Dry needling, muscle length/ tension how to focus on stretching dynamic/ static, scoliosis and anatomical changes, focus on RTC muscles and hip stabilizers. Ice/ heat. S&S.  Re- eval.   Treatment                            01/11/2024: Blank lines following charge title = not provided on this treatment date.   Manual:  TPDN No STM to cervical ps and upper traps, LS, sub occipitals Passive L LS stretching Cupping (static) to L UT There-ex: UT and LS stretching Cerivcal PROM Supine chin  tucks 5" x10 Seated shoulder rolls Standing bil ER GTB x10 Standing H abduction GTB 2x10     [facility] Adult PT Treatment:                                                DATE: 12/28/23 Therapeutic Exercise: Red band double ER 3-3-3 tempo, standing w/ volitional core contraction x8 Standing march w/ UE support cues for core contraction and full/comfortable hip ROM x8 BIL HEP handout + education, relevant anatomy/physiology and rationale for interventions   PATIENT EDUCATION:  Education details: Pt education on PT impairments, prognosis, and POC. Informed consent. Rationale for  interventions, safe/appropriate HEP performance Person educated: Patient Education method: Explanation, Demonstration, Tactile cues, Verbal cues Education comprehension: verbalized understanding, returned demonstration, verbal cues required, tactile cues required, and needs further education    HOME EXERCISE PROGRAM: Access Code: [email or web address] URL: [email or web address] Date: 12/28/2023 Prepared by: Mayme Spearman  Program Notes - please perform 3-3-3 tempo with external rotation, as done in clinic- with march, please use upper body support for balance and maintain core engagement  Exercises - Shoulder External Rotation and Scapular Retraction with Resistance  - 2-3 x daily - 1 sets - 6-8 reps - Standing Hip Flexion March  - 2-3 x daily - 1 sets - 8-10 reps  ASSESSMENT:  CLINICAL IMPRESSION: 4/21 Pt received TPDN today with no substantial muscle twitch response. Pt was educated on stretches and exercises for the neck and upper back. Significant time was spent on patient education for posture, abdominal bracing, and core activation for gym activities and ADL's. Pt will benefit from expanding gym program with cueing for all exercises. Next session can focus on gym exercises and trigger point therapy to cervical facet joint region. Pt will continue to benefit from skilled physical therapy to decrease irritability, increase ROM, and return to functional ADL's and recreational activities with limited pain.     IE: Patient is a 53 y.o. gentleman who was seen today for physical therapy evaluation and treatment for chronic neck/back pain. He denies overt limitations but does state over past couple years it has gradually begun to affect usual activities more notably. On exam he demonstrates concordant limitations in cervical, GH, and lumbar mobility, as well as mildly reduced strength relative to reported activity levels (enjoys sports, weight lifting, cardio). UE strength testing also  reproduces LBP. No red flags noted today. Pt tolerates HEP/exam well with report of mild muscular fatigue as expected, no adverse events. Recommend trial of skilled PT to address aforementioned deficits with aim of improving functional tolerance and reducing pain with typical activities. Pt departs today's session in no acute distress, all voiced concerns/questions addressed appropriately from PT perspective.    OBJECTIVE IMPAIRMENTS: decreased activity tolerance, decreased endurance, decreased mobility, decreased ROM, decreased strength, postural dysfunction, and pain.   ACTIVITY LIMITATIONS: lifting, bending, sitting, standing, and sleeping  PARTICIPATION LIMITATIONS: community activity and occupation  PERSONAL FACTORS: Time since onset of injury/illness/exacerbation and 3+ comorbidities: cardiomyopathy, NSVT, ICD  are also affecting patient's functional outcome.   REHAB POTENTIAL: Good  CLINICAL DECISION MAKING: Evolving/moderate complexity  EVALUATION COMPLEXITY: Moderate   GOALS:  SHORT TERM GOALS: Target date: 01/25/2024  Pt will demonstrate appropriate understanding and performance of initially prescribed HEP in order to facilitate improved independence with management of symptoms.  Baseline: HEP established  Goal status: INITIAL  2. Pt will report at least 25% improvement in overall pain levels over past week in order to facilitate improved tolerance to typical daily activities.   Baseline: up to 5/10 neck, 7/10 back  Goal status: INITIAL    LONG TERM GOALS: Target date: 02/22/2024  Pt will score less than or equal to 5% on NDI in order to demonstrate improved perception of function due to symptoms (MDC 10-13 pts per Adline Hook al 2009, 2010). Baseline: 20% Goal status: INITIAL  2. Pt will demonstrate bilateral cervical rotation AROM of at least 55 deg bilaterally in order to facilitate improved comfort w/ functional movements.  Baseline: see ROM chart above Goal status:  INITIAL  3. Pt will demonstrate at least 4+/5 global shoulder/hip MMT for improved symmetry of UE strength and improved tolerance to functional movements.  Baseline: see MMT chart above Goal status: INITIAL   4. Pt will report/demonstrate ability to sit for greater than 2 hrs with less than 2 point increase on NPS in order to demonstrate improved tolerance to occupational/daily tasks. Baseline: increased pain w/ sitting > 1 hr Goal status: INITIAL   5. Pt will report at least 50% decrease in overall pain levels in past week in order to facilitate improved tolerance to basic ADLs/mobility.   Baseline: up to 5/10 neck, 7/10 back   Goal status: INITIAL    6. Pt will demonstrate appropriate performance of final prescribed HEP in order to facilitate improved self-management of symptoms post-discharge.   Baseline: initial HEP prescribed  Goal status: INITIAL     PLAN:  PT FREQUENCY: 1-2x/week  PT DURATION: 8 weeks  PLANNED INTERVENTIONS: [id number]- PT Re-evaluation, 97110-Therapeutic exercises, 97530- Therapeutic activity, 97112- Neuromuscular re-education, 97535- Self Care, [id number]- Manual therapy, Patient/Family education, Balance training, Stair training, Taping, Dry Needling, Joint mobilization, Spinal mobilization, Cryotherapy, and Moist heat  PLAN FOR NEXT SESSION: Review/update HEP PRN. Work on [employer] exercises as appropriate with emphasis on core stability, postural endurance, and motor control. Symptom modification strategies as indicated/appropriate. Mindful of ICD and cardiac history.     Katheran Palms, Student-PT 02/07/2024, 10:25 AM

## 2024-02-11 ENCOUNTER — Encounter (HOSPITAL_BASED_OUTPATIENT_CLINIC_OR_DEPARTMENT_OTHER): Payer: Self-pay | Admitting: Physical Therapy

## 2024-02-11 ENCOUNTER — Ambulatory Visit (HOSPITAL_BASED_OUTPATIENT_CLINIC_OR_DEPARTMENT_OTHER): Admitting: Physical Therapy

## 2024-02-11 DIAGNOSIS — M542 Cervicalgia: Secondary | ICD-10-CM

## 2024-02-11 DIAGNOSIS — M5459 Other low back pain: Secondary | ICD-10-CM | POA: Diagnosis not present

## 2024-02-11 NOTE — Therapy (Signed)
 OUTPATIENT PHYSICAL THERAPY     Patient Name: VALENTE FOSBERG, MD MRN: [id number] DOB:06/21/1971, 53 y.o., male Today's Date: 02/11/2024  END OF SESSION:  PT End of Session - 02/11/24 0910     Visit Number 5    Number of Visits 17    Date for PT Re-Evaluation 02/22/24    Authorization Type [facility] employee    PT Start Time [phone]    PT Stop Time 0929    PT Time Calculation (min) 43 min    Activity Tolerance Patient tolerated treatment well;No increased pain    Behavior During Therapy WFL for tasks assessed/performed                Past Medical History:  Diagnosis Date   Apical variant hypertrophic cardiomyopathy (HCC)    Elevated troponin    Hypercholesterolemia    NSVT (nonsustained ventricular tachycardia) (HCC)    Past Surgical History:  Procedure Laterality Date   CARDIOVERSION N/A 05/14/2023   Procedure: CARDIOVERSION;  Surgeon: Lenise Quince, MD;  Location: [facility] INVASIVE CV LAB;  Service: Cardiovascular;  Laterality: N/A;   EP IMPLANTABLE DEVICE N/A 07/15/2016   Procedure: SubQ ICD Implant;  Surgeon: Verona Goodwill, MD;  Location: [facility] INVASIVE CV LAB;  Service: Cardiovascular;  Laterality: N/A;   KNEE SURGERY     SUBQ ICD CHANGEOUT N/A 11/17/2021   Procedure: SUBQ ICD CHANGEOUT;  Surgeon: Verona Goodwill, MD;  Location: [facility] INVASIVE CV LAB;  Service: Cardiovascular;  Laterality: N/A;   Patient Active Problem List   Diagnosis Date Noted   Atrial fibrillation (HCC) 03/17/2022   ICD (implantable cardioverter-defibrillator) in place 10/22/2019   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) 07/15/2016   Shoulder pain 06/04/2016   Apical variant hypertrophic cardiomyopathy (HCC) 11/09/2015   AIVR (accelerated idioventricular rhythm) (HCC) 11/09/2015   Elevated troponin 11/09/2015   Chest pain 11/08/2015   NSVT (nonsustained ventricular tachycardia) (HCC)    Pain in the chest     PCP: Arva Lathe, MD  REFERRING PROVIDER:  Arva Lathe, MD  REFERRING DIAG: M54.2 (ICD-10-CM) - Cervicalgia Dorsalgia, unspecified [M54.9]   THERAPY DIAG:  Other low back pain  Cervicalgia  Rationale for Evaluation and Treatment: Rehabilitation  ONSET DATE: several years, worsening over past few years  SUBJECTIVE:                                                                                                                                                                                                         SUBJECTIVE STATEMENT: 4/25 Pt says he  got called into work overnight so his neck feels tight. DN helped for a couple hours after but went back to the same irritability.    Eval: Pt endorses chronic history of neck/back pain that has fluctuated. States he has historically been quite active, including gymnastics when he was younger. Has cardiac history as noted above, but has returned to full activities including resistance training, cardio, and golf.  He notes that over the past few years his neck pain has begun to bother him more without any particular change in activity or MOI. Tendency towards L sided tightness and catching when turning to the L, tends to have transient improvement with use of theragun. He denies any N/T but does endorse infrequent headaches which he feels may be related, are L sided.  He states his back has also bothered him over a similar timeline. He states he has had imaging which shows scoliosis, was given heel lifts to correct apparent leg length discrepancy which he notes may have been somewhat helpful. He has seen a chiropractor in the past with some relief.  Does not endorse any red flags today.  Hand dominance: Right  PERTINENT HISTORY:  hypertrophic cardiomyopathy, NSVT, cardioversion, ICD implant  PAIN:  Are you having pain: tightness in neck at rest, no resting pain in back Location/description: neck L sided tightness, feels a catch when turning ; BIL LBP Best-worst over past  week: up to 5/10 for neck, 7/10 for back   - aggravating factors: lower body dressing, looking to the left, lying down, self distraction (back), golf (back), sitting >1 hr - Easing factors: lying on down on Rside, percussion gun  PRECAUTIONS: ICD  RED FLAGS: None    WEIGHT BEARING RESTRICTIONS: No  FALLS:  Has patient fallen in last 6 months? No  LIVING ENVIRONMENT: Multilevel home, no issues with stairs, lives with family   OCCUPATION: MD - pediatric ICU  PLOF: Independent - enjoys playing golf  PATIENT GOALS: reduction of pain   NEXT MD VISIT: TBD  OBJECTIVE:  Note: Objective measures were completed at Evaluation unless otherwise noted.  DIAGNOSTIC FINDINGS:  No recent imaging in chart - pt reports imaging showing scoliosis 3-4 years ago   PATIENT SURVEYS:  NDI 10/50, 20%  COGNITION: Overall cognitive status: Within functional limits for tasks assessed  SENSATION: Does not endorse any sensory deficits  POSTURE: increased lumbar lordosis, anterior pelvic tilt  PALPATION: Concordant tightness throughout periscapular/lumbar musculature, nonpainful   CERVICAL ROM:   ROM A/PROM (deg) eval  Flexion full  Extension 50%  Right lateral flexion 25% pinch  Left lateral flexion 50% s  Right rotation 45 deg  Left rotation 50 deg   (Blank rows = not tested) (Key: WFL = within functional limits not formally assessed, * = concordant pain, s = stiffness/stretching sensation, NT = not tested) Comment:    LUMBAR ROM:    A/PROM  eval A/PROM re-eval 04/05   Flexion Able to touch toes  Able to touch toes   Extension 100% 100%  Right lateral flexion    Left lateral flexion    Right rotation 75% s 100%   Left rotation 75% 100%    (Blank rows = not tested) (Key: WFL = within functional limits not formally assessed, * = concordant pain, s = stiffness/stretching sensation, NT = not tested)  Comments:   RANGE OF MOTION:      Right eval Left eval  Shoulder flexion  full Full w/ crepitus  Shoulder abduction 160  150  deg  Functional ER combo    Functional IR combo    Knee extension    Ankle dorsiflexion     (Blank rows = not tested) (Key: WFL = within functional limits not formally assessed, * = concordant pain, s = stiffness/stretching sensation, NT = not tested)  Comments:    STRENGTH TESTING:  MMT Right eval Left eval R/L  04/05  Shoulder flexion 4* 4 *   Shoulder abduction 4 4   Elbow flexion     Elbow extension     Grip strength (gross)     Hip flexion 4 * 3+ *   Hip abduction (modified sitting) 5 5 4+/4- in sidelying   Hip external rotation 4 4   Hip internal rotation 4 4   Knee flexion     Knee extension     Ankle dorsiflexion     Ankle plantarflexion      (Blank rows = not tested) (Key: WFL = within functional limits not formally assessed, * = concordant pain, s = stiffness/stretching sensation, NT = not tested)  Comments: of note, shoulder flex MMT elicits LBP    TREATMENT DATE:  4/25 Manual: All PROM performed with distraction to reduce pain and improve movement Trigger point release cervical paraspinals and UT Active release to UT with lateral flexion Suboccipital release There-ex: UE bike Row machine 3x10 85lbs  Cable pullovers 35lbs 3x10  Cable face pulls 3x10 15lbs     4/21 Manual: All PROM performed with distraction to reduce pain and improve movement Trigger Point Dry Needling  Subsequent Treatment: Instructions provided previously at initial dry needling treatment.  Instructions reviewed, if requested by the patient, prior to subsequent dry needling treatment.   Patient Verbal Consent Given: Yes Education Handout Provided: Previously Provided Muscles Treated: L UT, left cervical 3x  Electrical Stimulation Performed: No Treatment Response/Outcome: muscle twich response  STM and trigger point therapy to suboccipitals, UT, paraspinals  There-ex: Cervical SNAG  ext and rotn Pt education on exercise grading and weight progression Neuro-Re-ed Seated cable row 40lbs 2x10 (pt education provided on posture and abdominal bracing)  Treatment                            01/22/2024: Blank lines following charge title = not provided on this treatment date.   Manual:  Trigger Point Dry Needling  Initial Treatment: Pt instructed on Dry Needling rational, procedures, and possible side effects. Pt instructed to expect mild to moderate muscle soreness later in the day and/or into the next day.  Pt instructed in methods to reduce muscle soreness. Pt instructed to continue prescribed HEP.  Patient Verbal Consent Given: Yes Education Handout Provided: No Pt was informed with verbal instruction and consent.  Muscles Treated: L UT Electrical Stimulation Performed: Yes, Parameters: low amplitude, 50 freq Treatment Response/Outcome: Reduced tension, improved muscle elasticity.  There-ex:  Neuro:  Ball rolls against wall with focus on glute med activation on stabilizing leg.  Sidelying clams with GTB in two positions. Feet together, and reverse  I, T,Y in prone with focus on technique  Education:   Dry needling, muscle length/ tension how to focus on stretching dynamic/ static, scoliosis and anatomical changes, focus on RTC muscles and hip stabilizers. Ice/ heat. S&S.  Re- eval.   Treatment  01/11/2024: Blank lines following charge title = not provided on this treatment date.   Manual:  TPDN No STM to cervical ps and upper traps, LS, sub occipitals Passive L LS stretching Cupping (static) to L UT There-ex: UT and LS stretching Cerivcal PROM Supine chin tucks 5" x10 Seated shoulder rolls Standing bil ER GTB x10 Standing H abduction GTB 2x10     [facility] Adult PT Treatment:                                                DATE: 12/28/23 Therapeutic Exercise: Red band double ER 3-3-3 tempo, standing w/ volitional core  contraction x8 Standing march w/ UE support cues for core contraction and full/comfortable hip ROM x8 BIL HEP handout + education, relevant anatomy/physiology and rationale for interventions   PATIENT EDUCATION:  Education details: Pt education on PT impairments, prognosis, and POC. Informed consent. Rationale for interventions, safe/appropriate HEP performance Person educated: Patient Education method: Explanation, Demonstration, Tactile cues, Verbal cues Education comprehension: verbalized understanding, returned demonstration, verbal cues required, tactile cues required, and needs further education    HOME EXERCISE PROGRAM: Access Code: [email or web address] URL: [email or web address] Date: 12/28/2023 Prepared by: Mayme Spearman  Program Notes - please perform 3-3-3 tempo with external rotation, as done in clinic- with march, please use upper body support for balance and maintain core engagement  Exercises - Shoulder External Rotation and Scapular Retraction with Resistance  - 2-3 x daily - 1 sets - 6-8 reps - Standing Hip Flexion March  - 2-3 x daily - 1 sets - 8-10 reps  ASSESSMENT:  CLINICAL IMPRESSION: 4/25 Pt warmed up on the UE bike for with no increase in symptoms. Manual therapy was performed noting trigger point in paraspinal C3/C4 level. Pt tolerated manual therapy well. Followed up with cervical stretches and progressed to therapeutic exercises for the rest of the visit. Pt was education on nature of condition and aggressive strengthening. Pt will continue to benefit from skilled physical therapy to reduce irritability for functional ADL's.     IE: Patient is a 53 y.o. gentleman who was seen today for physical therapy evaluation and treatment for chronic neck/back pain. He denies overt limitations but does state over past couple years it has gradually begun to affect usual activities more notably. On exam he demonstrates concordant limitations in cervical, GH, and  lumbar mobility, as well as mildly reduced strength relative to reported activity levels (enjoys sports, weight lifting, cardio). UE strength testing also reproduces LBP. No red flags noted today. Pt tolerates HEP/exam well with report of mild muscular fatigue as expected, no adverse events. Recommend trial of skilled PT to address aforementioned deficits with aim of improving functional tolerance and reducing pain with typical activities. Pt departs today's session in no acute distress, all voiced concerns/questions addressed appropriately from PT perspective.    OBJECTIVE IMPAIRMENTS: decreased activity tolerance, decreased endurance, decreased mobility, decreased ROM, decreased strength, postural dysfunction, and pain.   ACTIVITY LIMITATIONS: lifting, bending, sitting, standing, and sleeping  PARTICIPATION LIMITATIONS: community activity and occupation  PERSONAL FACTORS: Time since onset of injury/illness/exacerbation and 3+ comorbidities: cardiomyopathy, NSVT, ICD  are also affecting patient's functional outcome.   REHAB POTENTIAL: Good  CLINICAL DECISION MAKING: Evolving/moderate complexity  EVALUATION COMPLEXITY: Moderate   GOALS:  SHORT TERM GOALS: Target date: 01/25/2024  Pt will demonstrate appropriate understanding and performance of  initially prescribed HEP in order to facilitate improved independence with management of symptoms.  Baseline: HEP established  Goal status: INITIAL   2. Pt will report at least 25% improvement in overall pain levels over past week in order to facilitate improved tolerance to typical daily activities.   Baseline: up to 5/10 neck, 7/10 back  Goal status: INITIAL    LONG TERM GOALS: Target date: 02/22/2024  Pt will score less than or equal to 5% on NDI in order to demonstrate improved perception of function due to symptoms (MDC 10-13 pts per Adline Hook al 2009, 2010). Baseline: 20% Goal status: INITIAL  2. Pt will demonstrate bilateral cervical  rotation AROM of at least 55 deg bilaterally in order to facilitate improved comfort w/ functional movements.  Baseline: see ROM chart above Goal status: INITIAL  3. Pt will demonstrate at least 4+/5 global shoulder/hip MMT for improved symmetry of UE strength and improved tolerance to functional movements.  Baseline: see MMT chart above Goal status: INITIAL   4. Pt will report/demonstrate ability to sit for greater than 2 hrs with less than 2 point increase on NPS in order to demonstrate improved tolerance to occupational/daily tasks. Baseline: increased pain w/ sitting > 1 hr Goal status: INITIAL   5. Pt will report at least 50% decrease in overall pain levels in past week in order to facilitate improved tolerance to basic ADLs/mobility.   Baseline: up to 5/10 neck, 7/10 back   Goal status: INITIAL    6. Pt will demonstrate appropriate performance of final prescribed HEP in order to facilitate improved self-management of symptoms post-discharge.   Baseline: initial HEP prescribed  Goal status: INITIAL     PLAN:  PT FREQUENCY: 1-2x/week  PT DURATION: 8 weeks  PLANNED INTERVENTIONS: [id number]- PT Re-evaluation, 97110-Therapeutic exercises, 97530- Therapeutic activity, 97112- Neuromuscular re-education, 97535- Self Care, [id number]- Manual therapy, Patient/Family education, Balance training, Stair training, Taping, Dry Needling, Joint mobilization, Spinal mobilization, Cryotherapy, and Moist heat  PLAN FOR NEXT SESSION: Review/update HEP PRN. Work on [employer] exercises as appropriate with emphasis on core stability, postural endurance, and motor control. Symptom modification strategies as indicated/appropriate. Mindful of ICD and cardiac history.     Katheran Palms, Student-PT 02/11/2024, 1:13 PM

## 2024-02-19 ENCOUNTER — Ambulatory Visit (HOSPITAL_BASED_OUTPATIENT_CLINIC_OR_DEPARTMENT_OTHER): Attending: Internal Medicine | Admitting: Physical Therapy

## 2024-02-19 ENCOUNTER — Encounter (HOSPITAL_BASED_OUTPATIENT_CLINIC_OR_DEPARTMENT_OTHER): Payer: Self-pay | Admitting: Physical Therapy

## 2024-02-19 DIAGNOSIS — M542 Cervicalgia: Secondary | ICD-10-CM | POA: Diagnosis not present

## 2024-02-19 DIAGNOSIS — M5459 Other low back pain: Secondary | ICD-10-CM | POA: Insufficient documentation

## 2024-02-19 NOTE — Therapy (Unsigned)
 OUTPATIENT PHYSICAL THERAPY     Patient Name: NOLE MONAHAN, MD MRN: [id number] DOB:05/19/1971, 53 y.o., male Today's Date: 02/21/2024  END OF SESSION:  PT End of Session - 02/21/24 1256     Visit Number 6    Number of Visits 17    Date for PT Re-Evaluation 02/22/24    Authorization Type [facility] employee    PT Start Time 0831    PT Stop Time 0910    PT Time Calculation (min) 39 min    Activity Tolerance Patient tolerated treatment well;No increased pain    Behavior During Therapy WFL for tasks assessed/performed                 Past Medical History:  Diagnosis Date   Apical variant hypertrophic cardiomyopathy (HCC)    Elevated troponin    Hypercholesterolemia    NSVT (nonsustained ventricular tachycardia) (HCC)    Past Surgical History:  Procedure Laterality Date   CARDIOVERSION N/A 05/14/2023   Procedure: CARDIOVERSION;  Surgeon: Lenise Quince, MD;  Location: [facility] INVASIVE CV LAB;  Service: Cardiovascular;  Laterality: N/A;   EP IMPLANTABLE DEVICE N/A 07/15/2016   Procedure: SubQ ICD Implant;  Surgeon: Verona Goodwill, MD;  Location: [facility] INVASIVE CV LAB;  Service: Cardiovascular;  Laterality: N/A;   KNEE SURGERY     SUBQ ICD CHANGEOUT N/A 11/17/2021   Procedure: SUBQ ICD CHANGEOUT;  Surgeon: Verona Goodwill, MD;  Location: [facility] INVASIVE CV LAB;  Service: Cardiovascular;  Laterality: N/A;   Patient Active Problem List   Diagnosis Date Noted   Atrial fibrillation (HCC) 03/17/2022   ICD (implantable cardioverter-defibrillator) in place 10/22/2019   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) 07/15/2016   Shoulder pain 06/04/2016   Apical variant hypertrophic cardiomyopathy (HCC) 11/09/2015   AIVR (accelerated idioventricular rhythm) (HCC) 11/09/2015   Elevated troponin 11/09/2015   Chest pain 11/08/2015   NSVT (nonsustained ventricular tachycardia) (HCC)    Pain in the chest     PCP: Arva Lathe, MD  REFERRING PROVIDER:  Arva Lathe, MD  REFERRING DIAG: M54.2 (ICD-10-CM) - Cervicalgia Dorsalgia, unspecified [M54.9]   THERAPY DIAG:  Other low back pain  Cervicalgia  Rationale for Evaluation and Treatment: Rehabilitation  ONSET DATE: several years, worsening over past few years  SUBJECTIVE:                                                                                                                                                                                                         SUBJECTIVE STATEMENT: Sharp pain/kink in  Lt side of neck when moving to [place]. Pain at Rt SIJ.   Eval: Pt endorses chronic history of neck/back pain that has fluctuated. States he has historically been quite active, including gymnastics when he was younger. Has cardiac history as noted above, but has returned to full activities including resistance training, cardio, and golf.  He notes that over the past few years his neck pain has begun to bother him more without any particular change in activity or MOI. Tendency towards L sided tightness and catching when turning to the L, tends to have transient improvement with use of theragun. He denies any N/T but does endorse infrequent headaches which he feels may be related, are L sided.  He states his back has also bothered him over a similar timeline. He states he has had imaging which shows scoliosis, was given heel lifts to correct apparent leg length discrepancy which he notes may have been somewhat helpful. He has seen a chiropractor in the past with some relief.  Does not endorse any red flags today.  Hand dominance: Right  PERTINENT HISTORY:  hypertrophic cardiomyopathy, NSVT, cardioversion, ICD implant  PAIN:  Are you having pain: tightness in neck at rest, no resting pain in back Location/description: neck L sided tightness, feels a catch when turning ; BIL LBP Best-worst over past week: up to 5/10 for neck, 7/10 for back   - aggravating factors: lower body  dressing, looking to the left, lying down, self distraction (back), golf (back), sitting >1 hr - Easing factors: lying on down on Rside, percussion gun  PRECAUTIONS: ICD  RED FLAGS: None    WEIGHT BEARING RESTRICTIONS: No  FALLS:  Has patient fallen in last 6 months? No  LIVING ENVIRONMENT: Multilevel home, no issues with stairs, lives with family   OCCUPATION: MD - pediatric ICU  PLOF: Independent - enjoys playing golf  PATIENT GOALS: reduction of pain   NEXT MD VISIT: TBD  OBJECTIVE:  Note: Objective measures were completed at Evaluation unless otherwise noted.  DIAGNOSTIC FINDINGS:  No recent imaging in chart - pt reports imaging showing scoliosis 3-4 years ago   PATIENT SURVEYS:  NDI 10/50, 20%  COGNITION: Overall cognitive status: Within functional limits for tasks assessed  SENSATION: Does not endorse any sensory deficits  POSTURE: increased lumbar lordosis, anterior pelvic tilt  PALPATION: Concordant tightness throughout periscapular/lumbar musculature, nonpainful   CERVICAL ROM:   ROM A/PROM (deg) eval  Flexion full  Extension 50%  Right lateral flexion 25% pinch  Left lateral flexion 50% s  Right rotation 45 deg  Left rotation 50 deg   (Blank rows = not tested) (Key: WFL = within functional limits not formally assessed, * = concordant pain, s = stiffness/stretching sensation, NT = not tested) Comment:    LUMBAR ROM:    A/PROM  eval A/PROM re-eval 04/05   Flexion Able to touch toes  Able to touch toes   Extension 100% 100%  Right lateral flexion    Left lateral flexion    Right rotation 75% s 100%   Left rotation 75% 100%    (Blank rows = not tested) (Key: WFL = within functional limits not formally assessed, * = concordant pain, s = stiffness/stretching sensation, NT = not tested)  Comments:   RANGE OF MOTION:      Right eval Left eval  Shoulder flexion full Full w/ crepitus  Shoulder abduction 160  150 deg  Functional ER combo     Functional IR combo  Knee extension    Ankle dorsiflexion     (Blank rows = not tested) (Key: WFL = within functional limits not formally assessed, * = concordant pain, s = stiffness/stretching sensation, NT = not tested)  Comments:    STRENGTH TESTING:  MMT Right eval Left eval R/L  04/05  Shoulder flexion 4* 4 *   Shoulder abduction 4 4   Elbow flexion     Elbow extension     Grip strength (gross)     Hip flexion 4 * 3+ *   Hip abduction (modified sitting) 5 5 4+/4- in sidelying   Hip external rotation 4 4   Hip internal rotation 4 4   Knee flexion     Knee extension     Ankle dorsiflexion     Ankle plantarflexion      (Blank rows = not tested) (Key: WFL = within functional limits not formally assessed, * = concordant pain, s = stiffness/stretching sensation, NT = not tested)  Comments: of note, shoulder flex MMT elicits LBP    TREATMENT DATE:  5/3 Suboccipital release STM Lt cervical paraspinals Lt cervical sidebend with facet mobs  Supine core engagement with rib flare reduction Qped triceps- added opp LE ext Triceps & lat pull down in kneeling glut/core engagement Chair pull off bar Planks Upper trap stretch with sheet  4/25 Manual: All PROM performed with distraction to reduce pain and improve movement Trigger point release cervical paraspinals and UT Active release to UT with lateral flexion Suboccipital release There-ex: UE bike Row machine 3x10 85lbs  Cable pullovers 35lbs 3x10  Cable face pulls 3x10 15lbs     4/21 Manual: All PROM performed with distraction to reduce pain and improve movement Trigger Point Dry Needling  Subsequent Treatment: Instructions provided previously at initial dry needling treatment.  Instructions reviewed, if requested by the patient, prior to subsequent dry needling treatment.   Patient Verbal Consent Given: Yes Education Handout Provided: Previously Provided Muscles  Treated: L UT, left cervical 3x  Electrical Stimulation Performed: No Treatment Response/Outcome: muscle twich response  STM and trigger point therapy to suboccipitals, UT, paraspinals  There-ex: Cervical SNAG ext and rotn Pt education on exercise grading and weight progression Neuro-Re-ed Seated cable row 40lbs 2x10 (pt education provided on posture and abdominal bracing)  Treatment                            01/22/2024: Blank lines following charge title = not provided on this treatment date.   Manual:  Trigger Point Dry Needling  Initial Treatment: Pt instructed on Dry Needling rational, procedures, and possible side effects. Pt instructed to expect mild to moderate muscle soreness later in the day and/or into the next day.  Pt instructed in methods to reduce muscle soreness. Pt instructed to continue prescribed HEP.  Patient Verbal Consent Given: Yes Education Handout Provided: No Pt was informed with verbal instruction and consent.  Muscles Treated: L UT Electrical Stimulation Performed: Yes, Parameters: low amplitude, 50 freq Treatment Response/Outcome: Reduced tension, improved muscle elasticity.  There-ex:  Neuro:  Ball rolls against wall with focus on glute med activation on stabilizing leg.  Sidelying clams with GTB in two positions. Feet together, and reverse  I, T,Y in prone with focus on technique  Education:   Dry needling, muscle length/ tension how to focus on stretching dynamic/ static, scoliosis and anatomical changes, focus on RTC  muscles and hip stabilizers. Ice/ heat. S&S.  Re- eval.   Treatment                            01/11/2024: Blank lines following charge title = not provided on this treatment date.   Manual:  TPDN No STM to cervical ps and upper traps, LS, sub occipitals Passive L LS stretching Cupping (static) to L UT There-ex: UT and LS stretching Cerivcal PROM Supine chin tucks 5" x10 Seated shoulder rolls Standing bil ER  GTB x10 Standing H abduction GTB 2x10     [facility] Adult PT Treatment:                                                DATE: 12/28/23 Therapeutic Exercise: Red band double ER 3-3-3 tempo, standing w/ volitional core contraction x8 Standing march w/ UE support cues for core contraction and full/comfortable hip ROM x8 BIL HEP handout + education, relevant anatomy/physiology and rationale for interventions   PATIENT EDUCATION:  Education details: Pt education on PT impairments, prognosis, and POC. Informed consent. Rationale for interventions, safe/appropriate HEP performance Person educated: Patient Education method: Explanation, Demonstration, Tactile cues, Verbal cues Education comprehension: verbalized understanding, returned demonstration, verbal cues required, tactile cues required, and needs further education    HOME EXERCISE PROGRAM: Access Code: [email or web address] URL: [email or web address] Date: 12/28/2023 Prepared by: Mayme Spearman  Program Notes - please perform 3-3-3 tempo with external rotation, as done in clinic- with march, please use upper body support for balance and maintain core engagement  Exercises - Shoulder External Rotation and Scapular Retraction with Resistance  - 2-3 x daily - 1 sets - 6-8 reps - Standing Hip Flexion March  - 2-3 x daily - 1 sets - 8-10 reps  ASSESSMENT:  CLINICAL IMPRESSION: Symptoms are consistent with scoliotic curve- educated on the importance of stability in surrounding musculature to decrease compensatory patterns and pain.     IE: Patient is a 53 y.o. gentleman who was seen today for physical therapy evaluation and treatment for chronic neck/back pain. He denies overt limitations but does state over past couple years it has gradually begun to affect usual activities more notably. On exam he demonstrates concordant limitations in cervical, GH, and lumbar mobility, as well as mildly reduced strength relative to reported activity  levels (enjoys sports, weight lifting, cardio). UE strength testing also reproduces LBP. No red flags noted today. Pt tolerates HEP/exam well with report of mild muscular fatigue as expected, no adverse events. Recommend trial of skilled PT to address aforementioned deficits with aim of improving functional tolerance and reducing pain with typical activities. Pt departs today's session in no acute distress, all voiced concerns/questions addressed appropriately from PT perspective.    OBJECTIVE IMPAIRMENTS: decreased activity tolerance, decreased endurance, decreased mobility, decreased ROM, decreased strength, postural dysfunction, and pain.   ACTIVITY LIMITATIONS: lifting, bending, sitting, standing, and sleeping  PARTICIPATION LIMITATIONS: community activity and occupation  PERSONAL FACTORS: Time since onset of injury/illness/exacerbation and 3+ comorbidities: cardiomyopathy, NSVT, ICD  are also affecting patient's functional outcome.   REHAB POTENTIAL: Good  CLINICAL DECISION MAKING: Evolving/moderate complexity  EVALUATION COMPLEXITY: Moderate   GOALS:  SHORT TERM GOALS: Target date: 01/25/2024  Pt will demonstrate appropriate understanding and performance of initially prescribed HEP in order to facilitate improved independence with management  of symptoms.  Baseline: HEP established  Goal status: INITIAL   2. Pt will report at least 25% improvement in overall pain levels over past week in order to facilitate improved tolerance to typical daily activities.   Baseline: up to 5/10 neck, 7/10 back  Goal status: INITIAL    LONG TERM GOALS: Target date: 02/22/2024  Pt will score less than or equal to 5% on NDI in order to demonstrate improved perception of function due to symptoms (MDC 10-13 pts per Adline Hook al 2009, 2010). Baseline: 20% Goal status: INITIAL  2. Pt will demonstrate bilateral cervical rotation AROM of at least 55 deg bilaterally in order to facilitate improved comfort w/  functional movements.  Baseline: see ROM chart above Goal status: INITIAL  3. Pt will demonstrate at least 4+/5 global shoulder/hip MMT for improved symmetry of UE strength and improved tolerance to functional movements.  Baseline: see MMT chart above Goal status: INITIAL   4. Pt will report/demonstrate ability to sit for greater than 2 hrs with less than 2 point increase on NPS in order to demonstrate improved tolerance to occupational/daily tasks. Baseline: increased pain w/ sitting > 1 hr Goal status: INITIAL   5. Pt will report at least 50% decrease in overall pain levels in past week in order to facilitate improved tolerance to basic ADLs/mobility.   Baseline: up to 5/10 neck, 7/10 back   Goal status: INITIAL    6. Pt will demonstrate appropriate performance of final prescribed HEP in order to facilitate improved self-management of symptoms post-discharge.   Baseline: initial HEP prescribed  Goal status: INITIAL     PLAN:  PT FREQUENCY: 1-2x/week  PT DURATION: 8 weeks  PLANNED INTERVENTIONS: 97164- PT Re-evaluation, 97110-Therapeutic exercises, 97530- Therapeutic activity, 97112- Neuromuscular re-education, 97535- Self Care, [id number]- Manual therapy, Patient/Family education, Balance training, Stair training, Taping, Dry Needling, Joint mobilization, Spinal mobilization, Cryotherapy, and Moist heat  PLAN FOR NEXT SESSION: Review/update HEP PRN. Work on [employer] exercises as appropriate with emphasis on core stability, postural endurance, and motor control. Symptom modification strategies as indicated/appropriate. Mindful of ICD and cardiac history.     Marvelle Caudill C. Winthrop Shannahan PT, DPT 02/21/24 12:57 PM

## 2024-03-04 ENCOUNTER — Ambulatory Visit (HOSPITAL_BASED_OUTPATIENT_CLINIC_OR_DEPARTMENT_OTHER): Admitting: Physical Therapy

## 2024-03-04 ENCOUNTER — Encounter (HOSPITAL_BASED_OUTPATIENT_CLINIC_OR_DEPARTMENT_OTHER): Payer: Self-pay | Admitting: Physical Therapy

## 2024-03-04 DIAGNOSIS — M5459 Other low back pain: Secondary | ICD-10-CM

## 2024-03-04 DIAGNOSIS — M542 Cervicalgia: Secondary | ICD-10-CM

## 2024-03-04 NOTE — Therapy (Signed)
 OUTPATIENT PHYSICAL THERAPY     Patient Name: Bradley GESKE, MD MRN: [id number] DOB:01-01-1971, 53 y.o., male Today's Date: 03/04/2024  END OF SESSION:  PT End of Session - 03/04/24 1055     Visit Number 7    Number of Visits 17    Date for PT Re-Evaluation 04/04/24    Authorization Type [facility] employee    PT Start Time 0745    PT Stop Time 0828    PT Time Calculation (min) 43 min    Activity Tolerance Patient tolerated treatment well;No increased pain    Behavior During Therapy WFL for tasks assessed/performed                  Past Medical History:  Diagnosis Date   Apical variant hypertrophic cardiomyopathy (HCC)    Elevated troponin    Hypercholesterolemia    NSVT (nonsustained ventricular tachycardia) (HCC)    Past Surgical History:  Procedure Laterality Date   CARDIOVERSION N/A 05/14/2023   Procedure: CARDIOVERSION;  Surgeon: Lenise Quince, MD;  Location: [facility] INVASIVE CV LAB;  Service: Cardiovascular;  Laterality: N/A;   EP IMPLANTABLE DEVICE N/A 07/15/2016   Procedure: SubQ ICD Implant;  Surgeon: Verona Goodwill, MD;  Location[facility] INVASIVE CV LAB;  Service: Cardiovascular;  Laterality: N/A;   KNEE SURGERY     SUBQ ICD CHANGEOUT N/A 11/17/2021   Procedure: SUBQ ICD CHANGEOUT;  Surgeon: Verona Goodwill, MD;  Location: [facility] INVASIVE CV LAB;  Service: Cardiovascular;  Laterality: N/A;   Patient Active Problem List   Diagnosis Date Noted   Atrial fibrillation (HCC) 03/17/2022   ICD (implantable cardioverter-defibrillator) in place 10/22/2019   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) 07/15/2016   Shoulder pain 06/04/2016   Apical variant hypertrophic cardiomyopathy (HCC) 11/09/2015   AIVR (accelerated idioventricular rhythm) (HCC) 11/09/2015   Elevated troponin 11/09/2015   Chest pain 11/08/2015   NSVT (nonsustained ventricular tachycardia) (HCC)    Pain in the chest     PCP: Arva Lathe, MD  REFERRING PROVIDER:  Arva Lathe, MD  REFERRING DIAG: M54.2 (ICD-10-CM) - Cervicalgia Dorsalgia, unspecified [M54.9]   THERAPY DIAG:  Other low back pain  Cervicalgia  Rationale for Evaluation and Treatment: Rehabilitation  ONSET DATE: several years, worsening over past few years  SUBJECTIVE:                                                                                                                                                                                                         SUBJECTIVE STATEMENT: Stretch with  release after a few min but noted increased back pain following for a bit.   Eval: Pt endorses chronic history of neck/back pain that has fluctuated. States he has historically been quite active, including gymnastics when he was younger. Has cardiac history as noted above, but has returned to full activities including resistance training, cardio, and golf.  He notes that over the past few years his neck pain has begun to bother him more without any particular change in activity or MOI. Tendency towards L sided tightness and catching when turning to the L, tends to have transient improvement with use of theragun. He denies any N/T but does endorse infrequent headaches which he feels may be related, are L sided.  He states his back has also bothered him over a similar timeline. He states he has had imaging which shows scoliosis, was given heel lifts to correct apparent leg length discrepancy which he notes may have been somewhat helpful. He has seen a chiropractor in the past with some relief.  Does not endorse any red flags today.  Hand dominance: Right  PERTINENT HISTORY:  hypertrophic cardiomyopathy, NSVT, cardioversion, ICD implant  PAIN:  Are you having pain: tightness in neck at rest, no resting pain in back Location/description: neck L sided tightness, feels a catch when turning ; BIL LBP Best-worst over past week: up to 5/10 for neck, 7/10 for back   - aggravating  factors: lower body dressing, looking to the left, lying down, self distraction (back), golf (back), sitting >1 hr - Easing factors: lying on down on Rside, percussion gun  PRECAUTIONS: ICD  RED FLAGS: None    WEIGHT BEARING RESTRICTIONS: No  FALLS:  Has patient fallen in last 6 months? No  LIVING ENVIRONMENT: Multilevel home, no issues with stairs, lives with family   OCCUPATION: MD - pediatric ICU  PLOF: Independent - enjoys playing golf  PATIENT GOALS: reduction of pain   NEXT MD VISIT: TBD  OBJECTIVE:  Note: Objective measures were completed at Evaluation unless otherwise noted.  DIAGNOSTIC FINDINGS:  No recent imaging in chart - pt reports imaging showing scoliosis 3-4 years ago   PATIENT SURVEYS:  NDI 10/50, 20% NDI 5/17: 7/50   COGNITION: Overall cognitive status: Within functional limits for tasks assessed  SENSATION: Does not endorse any sensory deficits  POSTURE: increased lumbar lordosis, anterior pelvic tilt  PALPATION: Concordant tightness throughout periscapular/lumbar musculature, nonpainful   CERVICAL ROM:   ROM A/PROM (deg) eval  Flexion full  Extension 50%  Right lateral flexion 25% pinch  Left lateral flexion 50% s  Right rotation 45 deg  Left rotation 50 deg   (Blank rows = not tested) (Key: WFL = within functional limits not formally assessed, * = concordant pain, s = stiffness/stretching sensation, NT = not tested) Comment:    LUMBAR ROM:    A/PROM  eval A/PROM re-eval 04/05   Flexion Able to touch toes  Able to touch toes   Extension 100% 100%  Right lateral flexion    Left lateral flexion    Right rotation 75% s 100%   Left rotation 75% 100%    (Blank rows = not tested) (Key: WFL = within functional limits not formally assessed, * = concordant pain, s = stiffness/stretching sensation, NT = not tested)  Comments:   RANGE OF MOTION:      Right eval Left eval  Shoulder flexion full Full w/ crepitus  Shoulder  abduction 160  150 deg  Functional ER combo  Functional IR combo    Knee extension    Ankle dorsiflexion     (Blank rows = not tested) (Key: WFL = within functional limits not formally assessed, * = concordant pain, s = stiffness/stretching sensation, NT = not tested)  Comments:    STRENGTH TESTING:  MMT Right eval Left eval R/L  04/05  Shoulder flexion 4* 4 *   Shoulder abduction 4 4   Elbow flexion     Elbow extension     Grip strength (gross)     Hip flexion 4 * 3+ *   Hip abduction (modified sitting) 5 5 4+/4- in sidelying   Hip external rotation 4 4   Hip internal rotation 4 4   Knee flexion     Knee extension     Ankle dorsiflexion     Ankle plantarflexion      (Blank rows = not tested) (Key: WFL = within functional limits not formally assessed, * = concordant pain, s = stiffness/stretching sensation, NT = not tested)  Comments: of note, shoulder flex MMT elicits LBP    TREATMENT DATE:  5/17 Sidelying ITB stretch end of table- STM to Rt gluts when laying on Lt Prone STM to Rt lumbar paraspinals Sidelying hip abd- pillow under Lt side Bridge with tband Roller on lumbar spine Supine core - sacral support, noted improvement in form with slight turnout.   5/3 Suboccipital release STM Lt cervical paraspinals Lt cervical sidebend with facet mobs  Supine core engagement with rib flare reduction Qped triceps- added opp LE ext Triceps & lat pull down in kneeling glut/core engagement Chair pull off bar Planks Upper trap stretch with sheet  4/25 Manual: All PROM performed with distraction to reduce pain and improve movement Trigger point release cervical paraspinals and UT Active release to UT with lateral flexion Suboccipital release There-ex: UE bike Row machine 3x10 85lbs  Cable pullovers 35lbs 3x10  Cable face pulls 3x10 15lbs    PATIENT EDUCATION:  Education details: Pt education on PT impairments, prognosis, and POC.  Informed consent. Rationale for interventions, safe/appropriate HEP performance Person educated: Patient Education method: Explanation, Demonstration, Tactile cues, Verbal cues Education comprehension: verbalized understanding, returned demonstration, verbal cues required, tactile cues required, and needs further education    HOME EXERCISE PROGRAM: Access Code: [email or web address] URL: [email or web address] Date: 12/28/2023 Prepared by: Mayme Spearman  Program Notes - please perform 3-3-3 tempo with external rotation, as done in clinic- with march, please use upper body support for balance and maintain core engagement  Exercises - Shoulder External Rotation and Scapular Retraction with Resistance  - 2-3 x daily - 1 sets - 6-8 reps - Standing Hip Flexion March  - 2-3 x daily - 1 sets - 8-10 reps  ASSESSMENT:  CLINICAL IMPRESSION: Able to decrease tension with long duration stretch and pain in low back consistent with lack of proper stability once spasm is released. Pt choosing to work independently on strengthening with stretches for 1 month and then will return for re-evaluation. Reported NDI score has improved and discussed the importance of central stability so distal structures such as neck/shoulders have proper support.     IE: Patient is a 53 y.o. gentleman who was seen today for physical therapy evaluation and treatment for chronic neck/back pain. He denies overt limitations but does state over past couple years it has gradually begun to affect usual activities more notably. On exam he demonstrates concordant limitations in cervical, GH, and lumbar mobility, as well  as mildly reduced strength relative to reported activity levels (enjoys sports, weight lifting, cardio). UE strength testing also reproduces LBP. No red flags noted today. Pt tolerates HEP/exam well with report of mild muscular fatigue as expected, no adverse events. Recommend trial of skilled PT to address  aforementioned deficits with aim of improving functional tolerance and reducing pain with typical activities. Pt departs today's session in no acute distress, all voiced concerns/questions addressed appropriately from PT perspective.    OBJECTIVE IMPAIRMENTS: decreased activity tolerance, decreased endurance, decreased mobility, decreased ROM, decreased strength, postural dysfunction, and pain.   ACTIVITY LIMITATIONS: lifting, bending, sitting, standing, and sleeping  PARTICIPATION LIMITATIONS: community activity and occupation  PERSONAL FACTORS: Time since onset of injury/illness/exacerbation and 3+ comorbidities: cardiomyopathy, NSVT, ICD are also affecting patient's functional outcome.   REHAB POTENTIAL: Good  CLINICAL DECISION MAKING: Evolving/moderate complexity  EVALUATION COMPLEXITY: Moderate   GOALS:  SHORT TERM GOALS: Target date: 01/25/2024  Pt will demonstrate appropriate understanding and performance of initially prescribed HEP in order to facilitate improved independence with management of symptoms.  Baseline: HEP established  Goal status: INITIAL   2. Pt will report at least 25% improvement in overall pain levels over past week in order to facilitate improved tolerance to typical daily activities.   Baseline: up to 5/10 neck, 7/10 back  Goal status: INITIAL    LONG TERM GOALS: Target date: 02/22/2024  Pt will score less than or equal to 5% on NDI in order to demonstrate improved perception of function due to symptoms (MDC 10-13 pts per Adline Hook al 2009, 2010). Baseline: 20% Goal status: INITIAL  2. Pt will demonstrate bilateral cervical rotation AROM of at least 55 deg bilaterally in order to facilitate improved comfort w/ functional movements.  Baseline: see ROM chart above Goal status: INITIAL  3. Pt will demonstrate at least 4+/5 global shoulder/hip MMT for improved symmetry of UE strength and improved tolerance to functional movements.  Baseline: see MMT chart  above Goal status: INITIAL   4. Pt will report/demonstrate ability to sit for greater than 2 hrs with less than 2 point increase on NPS in order to demonstrate improved tolerance to occupational/daily tasks. Baseline: increased pain w/ sitting > 1 hr Goal status: INITIAL   5. Pt will report at least 50% decrease in overall pain levels in past week in order to facilitate improved tolerance to basic ADLs/mobility.   Baseline: up to 5/10 neck, 7/10 back   Goal status: INITIAL    6. Pt will demonstrate appropriate performance of final prescribed HEP in order to facilitate improved self-management of symptoms post-discharge.   Baseline: initial HEP prescribed  Goal status: INITIAL     PLAN:  PT FREQUENCY: 1-2x/week  PT DURATION: 8 weeks  PLANNED INTERVENTIONS: 97164- PT Re-evaluation, 97110-Therapeutic exercises, 97530- Therapeutic activity, 97112- Neuromuscular re-education, 97535- Self Care, 16109- Manual therapy, Patient/Family education, Balance training, Stair training, Taping, Dry Needling, Joint mobilization, Spinal mobilization, Cryotherapy, and Moist heat  PLAN FOR NEXT SESSION: Review/update HEP PRN. Work on [employer] exercises as appropriate with emphasis on core stability, postural endurance, and motor control. Symptom modification strategies as indicated/appropriate. Mindful of ICD and cardiac history.     Lutricia Widjaja C. Kaipo Ardis PT, DPT 03/04/24 11:03 AM

## 2024-03-08 ENCOUNTER — Encounter (HOSPITAL_BASED_OUTPATIENT_CLINIC_OR_DEPARTMENT_OTHER)

## 2024-03-11 ENCOUNTER — Other Ambulatory Visit (HOSPITAL_BASED_OUTPATIENT_CLINIC_OR_DEPARTMENT_OTHER): Payer: Self-pay

## 2024-03-15 ENCOUNTER — Ambulatory Visit (INDEPENDENT_AMBULATORY_CARE_PROVIDER_SITE_OTHER): Payer: No Typology Code available for payment source

## 2024-03-15 DIAGNOSIS — I422 Other hypertrophic cardiomyopathy: Secondary | ICD-10-CM

## 2024-03-18 ENCOUNTER — Encounter (HOSPITAL_COMMUNITY): Payer: Self-pay | Admitting: *Deleted

## 2024-03-18 ENCOUNTER — Emergency Department (HOSPITAL_COMMUNITY)

## 2024-03-18 ENCOUNTER — Emergency Department (HOSPITAL_COMMUNITY)
Admission: EM | Admit: 2024-03-18 | Discharge: 2024-03-19 | Disposition: A | Discharge status: Home / Self Care | Attending: Emergency Medicine | Admitting: Emergency Medicine

## 2024-03-18 ENCOUNTER — Other Ambulatory Visit: Payer: Self-pay

## 2024-03-18 DIAGNOSIS — Z7901 Long term (current) use of anticoagulants: Secondary | ICD-10-CM | POA: Insufficient documentation

## 2024-03-18 DIAGNOSIS — R0600 Dyspnea, unspecified: Secondary | ICD-10-CM | POA: Diagnosis not present

## 2024-03-18 DIAGNOSIS — R0789 Other chest pain: Secondary | ICD-10-CM | POA: Diagnosis not present

## 2024-03-18 DIAGNOSIS — R072 Precordial pain: Secondary | ICD-10-CM | POA: Diagnosis not present

## 2024-03-18 DIAGNOSIS — R079 Chest pain, unspecified: Secondary | ICD-10-CM | POA: Diagnosis not present

## 2024-03-18 LAB — BASIC METABOLIC PANEL WITH GFR
Anion gap: 11 (ref 5–15)
BUN: 13 mg/dL (ref 6–20)
CO2: 26 mmol/L (ref 22–32)
Calcium: 9.4 mg/dL (ref 8.9–10.3)
Chloride: 103 mmol/L (ref 98–111)
Creatinine, Ser: 1.35 mg/dL — ABNORMAL HIGH (ref 0.61–1.24)
GFR, Estimated: >60 mL/min (ref >60)
Glucose, Bld: 108 mg/dL — ABNORMAL HIGH (ref 70–99)
Potassium: 3.5 mmol/L (ref 3.5–5.1)
Sodium: 140 mmol/L (ref 135–145)

## 2024-03-18 LAB — CBC
HCT: 44.4 % (ref 39.0–52.0)
Hemoglobin: 14.7 g/dL (ref 13.0–17.0)
MCH: 30 pg (ref 26.0–34.0)
MCHC: 33.1 g/dL (ref 30.0–36.0)
MCV: 90.6 fL (ref 80.0–100.0)
Platelets: 224 10*3/uL (ref 150–400)
RBC: 4.9 MIL/uL (ref 4.22–5.81)
RDW: 12.6 % (ref 11.5–15.5)
WBC: 5.7 10*3/uL (ref 4.0–10.5)
nRBC: 0 % (ref 0.0–0.2)

## 2024-03-18 NOTE — ED Triage Notes (Signed)
 Pt is c/o chest pain for the past 2 hours with difficulty breathing.

## 2024-03-19 LAB — TROPONIN I (HIGH SENSITIVITY)
Troponin I (High Sensitivity): 91 ng/L — ABNORMAL HIGH (ref <18)
Troponin I (High Sensitivity): 78 ng/L — ABNORMAL HIGH (ref <18)

## 2024-03-19 NOTE — ED Provider Notes (Signed)
 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  Patient describes the pain as a deep pressure.  Patient had similar episode last month previously.  That lasted only a few minutes.  This episode was more intense lasting approximately 30 minutes.  It was not exertional and nonradiating. Pain slowly settled into his epigastrium.  No associated shortness of breath.  No recent fevers or infections.  No cough or congestion.  No nausea or vomiting.  No extremity edema.  The patient reports that his heart rate during this episode was 60s and regular.    The history is provided by the patient.    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 Authorizing Provider  acetaminophen  (TYLENOL ) 500 MG tablet Take 500-1,000 mg by mouth every 6 (six) hours as needed for moderate pain.    [provider]  apixaban  (ELIQUIS ) 5 MG TABS tablet Take 1 tablet (5 mg total) by mouth 2 (two) times daily. 12/15/23   Verona Goodwill, MD  atorvastatin  (LIPITOR) 20 MG tablet Take 1 tablet (20 mg total) by mouth daily. 10/14/23     atorvastatin  (LIPITOR) 20 MG tablet Take 1 tablet (20 mg total) by mouth daily. 11/26/23     diphenhydrAMINE  (BENADRYL ) 25 MG tablet Take 25 mg by mouth at bedtime as needed for allergies or sleep.    [provider]  docusate sodium (COLACE) 50 MG capsule Take 50 mg by mouth daily as needed for mild constipation.    [provider]  fluticasone  (FLONASE ) 50 MCG/ACT nasal spray Place 1-2 sprays into both nostrils daily as needed for allergies or rhinitis.    [provider]  metoprolol  tartrate (LOPRESSOR ) 25 MG tablet Take 1 tablet (25 mg total) by mouth 2 (two) times daily as needed (for elevated heart rates). 01/04/23   Verona Goodwill, MD  Multiple Vitamin (MULTIVITAMIN WITH MINERALS) TABS tablet Take 1 tablet by mouth 3 (three) times a week.    [provider]  oxymetazoline (AFRIN) 0.05 % nasal spray Place 1 spray into both nostrils at bedtime as needed for congestion.    [provider]  psyllium (METAMUCIL) 58.6 % powder Take 1 packet by mouth 4 (four) times a week. At night    [provider]  senna-docusate (SENOKOT-S) 8.6-50 MG tablet Take 1 tablet by mouth daily as needed for mild constipation.    [provider]  tobramycin -dexamethasone  (TOBRADEX ) ophthalmic solution Apply  to the affected eye as directed as directed for 14 days. 10/15/23                                                                                                                                       Allergies Patient has no known allergies.  Review of Systems Review of Systems As noted in HPI  Physical Exam Vital Signs  I have reviewed the triage vital signs BP (!) 145/94   Pulse 63   Temp 97.9 F (36.6 C)   Resp 15    SpO2 100%   Physical Exam Vitals reviewed.  Constitutional:      General: He is not in acute distress.    Appearance: He is well-developed. He is not diaphoretic.  HENT:     Head: Normocephalic and atraumatic.     Nose: Nose normal.  Eyes:     General: No scleral icterus.       Right eye: No discharge.        Left eye: No discharge.     Conjunctiva/sclera: Conjunctivae normal.     Pupils: Pupils are equal, round, and reactive to light.  Cardiovascular:     Rate and Rhythm: Normal rate and regular rhythm.     Heart sounds: No murmur heard.    No friction rub. No gallop.  Pulmonary:     Effort: Pulmonary effort is normal. No respiratory distress.     Breath sounds: Normal breath sounds. No stridor. No rales.  Abdominal:     General: There is no distension.     Palpations: Abdomen is soft.     Tenderness: There is no abdominal tenderness. There is no guarding or rebound.  Musculoskeletal:        General: No tenderness.     Cervical back: Normal range of motion and neck supple.     Right lower leg: No edema.     Left lower leg: No edema.  Skin:    General: Skin is warm and dry.     Findings: No erythema or rash.  Neurological:     Mental Status: He is alert and oriented to person, place, and time.     ED Results and Treatments Labs (all labs ordered are listed, but only abnormal results are displayed) Labs Reviewed  BASIC METABOLIC PANEL WITH GFR - Abnormal; Notable for the following components:      Result Value   Glucose, Bld 108 (*)    Creatinine, Ser 1.35 (*)    All other components within normal limits  TROPONIN I (HIGH SENSITIVITY) - Abnormal; Notable for the following components:   Troponin I (High Sensitivity) 91 (*)    All other components within normal limits  TROPONIN I (HIGH SENSITIVITY) - Abnormal; Notable for the following components:   Troponin I (High Sensitivity) 78 (*)    All other components within normal limits  CBC  EKG  EKG Interpretation Date/Time:  Saturday Mar 18 2024 23:22:30 EDT Ventricular Rate:  59 PR Interval:  156 QRS Duration:  88 QT Interval:  396 QTC Calculation: 392 R Axis:   39  Text Interpretation: Sinus bradycardia with Premature atrial complexes Nonspecific T wave abnormality Abnormal ECG When compared with ECG of 14-May-2023 11:02, PREVIOUS ECG IS PRESENT No significant change was found Confirmed by Townsend Freud [phone]) on 03/19/2024 1:15:02 AM       Radiology DG Chest 2 View Result Date: 03/19/2024 EXAM: 2 VIEW(S) XRAY OF THE CHEST 03/18/2024 11:57:07 PM COMPARISON: 07/15/2016. CLINICAL HISTORY: Chest pain/dyspnea. Chest pain/dyspnea. Chest pain/dyspnea. Chest pain/dyspnea. FINDINGS: LUNGS AND PLEURA: No focal pulmonary opacity. No pulmonary edema. No pleural effusion. No pneumothorax. HEART AND MEDIASTINUM: No acute abnormality of the cardiac and mediastinal silhouettes. BONES AND SOFT TISSUES: Stable subcutaneous ICD. No acute osseous abnormality. IMPRESSION: 1. No acute process. Electronically signed by: Zadie Herter MD 03/19/2024 12:00 AM EDT RP Workstation: [id number]    Medications Ordered in ED Medications - No data to display Procedures Procedures  (including critical care time) Medical Decision Making / ED Course   Medical Decision Making Amount and/or Complexity of Data Reviewed Labs: ordered. Decision-making details documented in ED Course. Radiology: ordered and independent interpretation performed. Decision-making details documented in ED Course. ECG/medicine tests: ordered and independent interpretation performed. Decision-making details documented in ED Course.    Substernal chest pressure differential diagnosis considered.  Workup below.  EKG without acute ischemic changes or evidence of pericarditis.  Initial troponin is elevated however is close to his  previous baseline.  Second troponin was lower but relatively flat.  Patient's calcium  score in recent years was 0.  Concern for ACS is low.  Presentation is not classic for arctic dissection or esophageal perforation.  Low suspicion for pulmonary embolism.  Pain was reproduced after drinking ice water, though to a lesser extent.  Favor GI etiology.  Labs reassuring without leukocytosis or anemia.  No significant electrolyte derangements.  Baseline renal function.    Final Clinical Impression(s) / ED Diagnoses Final diagnoses:  Chest discomfort   The patient appears reasonably screened and/or stabilized for discharge and I doubt any other medical condition or other [facility] requiring further screening, evaluation, or treatment in the ED at this time. I have discussed the findings, Dx and Tx plan with the patient/family who expressed understanding and agree(s) with the plan. Discharge instructions discussed at length. The patient/family was given strict return precautions who verbalized understanding of the instructions. No further questions at time of discharge.  Disposition: Discharge  Condition: Good  ED Discharge Orders     None         Follow Up: Janis Melena, MD [address] [place] [address] [phone]  Call  to schedule an appointment for close follow up  Verona Goodwill, MD [address] [place] [address] [phone]  Call  to schedule an appointment for close follow up     This chart was dictated using voice recognition software.  Despite best efforts to proofread,  errors can occur which can change the documentation meaning.    Lindle Rhea, MD 03/19/24 [phone]

## 2024-03-20 ENCOUNTER — Encounter: Payer: Self-pay | Admitting: Internal Medicine

## 2024-03-20 LAB — CUP PACEART REMOTE DEVICE CHECK
Battery Remaining Percentage: 75 %
Date Time Interrogation Session: 20250530202000
HighPow Impedance: 60 Ohm
Implantable Lead Connection Status: 753985
Implantable Lead Implant Date: 20170927
Implantable Lead Location: 753862
Implantable Lead Model: 3401
Implantable Pulse Generator Implant Date: 20230130
Pulse Gen Serial Number: 171972

## 2024-03-26 ENCOUNTER — Ambulatory Visit: Payer: Self-pay | Admitting: Cardiology

## 2024-04-03 NOTE — Progress Notes (Signed)
 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   Discussed the use of AI scribe software for clinical note transcription with the patient, who gave verbal consent to proceed.  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  Two weekends ago, he experienced severe chest pain, which led to an emergency department visit. The patient had elevated troponin levels, but these were lower than previous levels. He has a history of hypertrophic cardiomyopathy and experiences pain triggered by bending over or yawning, suggesting a possible reflux component. He recalls an episode of severe chest pain after consuming ice cream, with a similar episode occurring three weeks prior after having a slushy. During these episodes, his blood pressure spiked to 190/110 mmHg, then decreased to 170/100 mmHg, and was 140s/90s mmHg upon arrival at the emergency department.  He has intentionally lost 12-13 pounds over the last year and a half, reducing his weight from the mid-170s to the 150s. He switched from using a treadmill to an elliptical for exercise. Initially, his heart rate would reach 95-100 bpm, making it difficult to sustain exercise. Now, he can maintain a heart rate of 85-90 bpm, and if he increases speed, it may reach 115-118 bpm.   He is not currently on metoprolol  regularly but uses it as needed for atrial fibrillation situations. His resting heart rate is 51 bpm. He has had an ablation in the past, which  may have affected his heart rate regulation.  His children, aged 62, 35, and 71, had yearly echocardiograms until age 59.   Review of systems complete and found to be negative unless listed in HPI.   EP Information / Studies Reviewed:    EKG is not ordered today. EKG from 03/18/24 reviewed which showed sinus rhythm @59bpm , PR , QRS 88ms.       Cardiac MRI 11/14/15:  IMPRESSION: 1) Normal LV size with morphology consistent with apical hypertrophic cardiomyopathy Also some hypertrophy of the mid septum. Apical cavity obliteration in systole   2) No SAM or LVOT gradient   3) Diffuse delayed hyper- enhancement with gadolinium in septum apex but also anterior and inferior wall mid myocardium   4) Quantitative EF 75%   5) Normal RV  Echo 04/22/21:   1. Left ventricular ejection fraction, by estimation, is 60 to 65%. The  left ventricle has normal function. The left ventricle has no regional  wall motion abnormalities. There is mild concentric left ventricular  hypertrophy. Left ventricular diastolic  parameters are consistent with Grade I diastolic dysfunction (impaired  relaxation).   2. Right ventricular systolic function is normal. The right ventricular  size is normal. There is normal pulmonary artery systolic pressure. The  estimated right ventricular systolic pressure is 29.0 mmHg.   3. The mitral valve is normal in structure. Trivial mitral valve  regurgitation. No evidence of mitral stenosis.   4. The aortic valve is tricuspid. Aortic valve regurgitation is not  visualized. Mild aortic valve sclerosis is present, with  no evidence of  aortic valve stenosis.   5. The inferior vena cava is normal in size with greater than 50%  respiratory variability, suggesting right atrial pressure of 3 mmHg.    Physical Exam:   VS:  BP 118/80   Pulse (!) 56   Ht 5' 9 (1.753 m)   Wt 159 lb (72.1 kg)   SpO2 97%   BMI 23.48 kg/m    Wt Readings from Last 3 Encounters:  04/04/24  159 lb (72.1 kg)  05/14/23 170 lb (77.1 kg)  05/13/23 171 lb (77.6 kg)     GEN: Well nourished, well developed in no acute distress NECK: No JVD CARDIAC: Bradycardic, regular rhythm RESPIRATORY:  Clear to auscultation without rales, wheezing or rhonchi  ABDOMEN: Soft, non-distended EXTREMITIES:  No edema; No deformity   ASSESSMENT AND PLAN:    S/p S-ICD:  -In clinic device interrogation performed today.  Presenting rhythm is ventricular sensed.  Battery and lead parameters stable with stable impedance and sensing.  No arrhythmias noted. -Continue remote monitoring.  Apical HCM  NSVT -No evidence of heart failure on exam. -No history of obstruction. -Icd in place with apical variant and presence of LGE. -Repeat echocardiogram given recent chest pain.  Paroxysmal atrial fibrillation s/p ablation: PVI and posterior wall ablation 06/2023 with [facility]ables at [facility]. Secondary hypercoagulable state due to AF: - Continue Eliquis  5 mg twice daily. CHA2DS2-VASc score irrelevant in setting of hypertrophic cardiomyopathy. - Medication options are limited by resting bradycardia.  He has metoprolol  available as needed.  Follow up with Dr. Kennyth in 6 months  Signed, Fonda Kennyth, MD

## 2024-04-04 ENCOUNTER — Encounter: Payer: Self-pay | Admitting: Cardiology

## 2024-04-04 ENCOUNTER — Ambulatory Visit: Attending: Cardiology | Admitting: Cardiology

## 2024-04-04 VITALS — BP 118/80 | HR 56 | Ht 69.0 in | Wt 159.0 lb

## 2024-04-04 DIAGNOSIS — Z95810 Presence of automatic (implantable) cardiac defibrillator: Secondary | ICD-10-CM | POA: Diagnosis not present

## 2024-04-04 DIAGNOSIS — D6869 Other thrombophilia: Secondary | ICD-10-CM | POA: Diagnosis not present

## 2024-04-04 DIAGNOSIS — I4729 Other ventricular tachycardia: Secondary | ICD-10-CM | POA: Diagnosis not present

## 2024-04-04 DIAGNOSIS — I422 Other hypertrophic cardiomyopathy: Secondary | ICD-10-CM

## 2024-04-04 DIAGNOSIS — I480 Paroxysmal atrial fibrillation: Secondary | ICD-10-CM

## 2024-04-04 NOTE — Patient Instructions (Signed)
 Medication Instructions:  Your physician recommends that you continue on your current medications as directed. Please refer to the Current Medication list given to you today.  *If you need a refill on your cardiac medications before your next appointment, please call your pharmacy*  Testing/Procedures: Echocardiogram Your physician has requested that you have an echocardiogram. Echocardiography is a painless test that uses sound waves to create images of your heart. It provides your doctor with information about the size and shape of your heart and how well your heart's chambers and valves are working. This procedure takes approximately one hour. There are no restrictions for this procedure. Please do NOT wear cologne, perfume, aftershave, or lotions (deodorant is allowed). Please arrive 15 minutes prior to your appointment time.  Please note: We ask at that you not bring children with you during ultrasound (echo/ vascular) testing. Due to room size and safety concerns, children are not allowed in the ultrasound rooms during exams. Our front office staff cannot provide observation of children in our lobby area while testing is being conducted. An adult accompanying a patient to their appointment will only be allowed in the ultrasound room at the discretion of the ultrasound technician under special circumstances. We apologize for any inconvenience.   Follow-Up: At [facility], you and your health needs are our priority.  As part of our continuing mission to provide you with exceptional heart care, our providers are all part of one team.  This team includes your primary Cardiologist (physician) and Advanced Practice Providers or APPs (Physician Assistants and Nurse Practitioners) who all work together to provide you with the care you need, when you need it.  Your next appointment:   1 year  Provider:   You may see Ardeen Kohler, MD or one of the following Advanced Practice Providers on  your designated Care Team:   Mertha Abrahams, [place] [address]" [place], PA-C Suzann Riddle, NP Creighton Doffing, NP

## 2024-04-07 ENCOUNTER — Encounter (HOSPITAL_BASED_OUTPATIENT_CLINIC_OR_DEPARTMENT_OTHER): Payer: Self-pay | Admitting: Physical Therapy

## 2024-04-15 ENCOUNTER — Other Ambulatory Visit (HOSPITAL_BASED_OUTPATIENT_CLINIC_OR_DEPARTMENT_OTHER): Payer: Self-pay

## 2024-04-18 ENCOUNTER — Encounter (HOSPITAL_BASED_OUTPATIENT_CLINIC_OR_DEPARTMENT_OTHER): Payer: Self-pay | Admitting: Physical Therapy

## 2024-04-18 ENCOUNTER — Ambulatory Visit (HOSPITAL_BASED_OUTPATIENT_CLINIC_OR_DEPARTMENT_OTHER): Payer: Self-pay | Attending: Internal Medicine | Admitting: Physical Therapy

## 2024-04-18 DIAGNOSIS — M5459 Other low back pain: Secondary | ICD-10-CM | POA: Diagnosis not present

## 2024-04-18 DIAGNOSIS — M542 Cervicalgia: Secondary | ICD-10-CM | POA: Insufficient documentation

## 2024-04-18 NOTE — Therapy (Signed)
 OUTPATIENT PHYSICAL THERAPY     Patient Name: Bradley ALVERIO, MD MRN: [id number] DOB:05-Jul-1971, 53 y.o., male Today's Date: 04/18/2024  END OF SESSION:  PT End of Session - 04/18/24 0801     Visit Number 8    Number of Visits 17    Date for PT Re-Evaluation 05/20/24    Authorization Type [facility] employee    PT Start Time 0759    PT Stop Time 0837    PT Time Calculation (min) 38 min    Activity Tolerance Patient tolerated treatment well;No increased pain    Behavior During Therapy WFL for tasks assessed/performed               Past Medical History:  Diagnosis Date   Apical variant hypertrophic cardiomyopathy (HCC)    Elevated troponin    Hypercholesterolemia    NSVT (nonsustained ventricular tachycardia) (HCC)    Past Surgical History:  Procedure Laterality Date   CARDIOVERSION N/A 05/14/2023   Procedure: CARDIOVERSION;  Surgeon: Pietro Redell RAMAN, MD;  Location: [facility] INVASIVE CV LAB;  Service: Cardiovascular;  Laterality: N/A;   EP IMPLANTABLE DEVICE N/A 07/15/2016   Procedure: SubQ ICD Implant;  Surgeon: Elspeth JAYSON Sage, MD;  Location: [facility] INVASIVE CV LAB;  Service: Cardiovascular;  Laterality: N/A;   KNEE SURGERY     SUBQ ICD CHANGEOUT N/A 11/17/2021   Procedure: SUBQ ICD CHANGEOUT;  Surgeon: Sage Elspeth JAYSON, MD;  Location: [facility] INVASIVE CV LAB;  Service: Cardiovascular;  Laterality: N/A;   Patient Active Problem List   Diagnosis Date Noted   Atrial fibrillation (HCC) 03/17/2022   ICD (implantable cardioverter-defibrillator) in place 10/22/2019   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) 07/15/2016   Shoulder pain 06/04/2016   Apical variant hypertrophic cardiomyopathy (HCC) 11/09/2015   AIVR (accelerated idioventricular rhythm) (HCC) 11/09/2015   Elevated troponin 11/09/2015   Chest pain 11/08/2015   NSVT (nonsustained ventricular tachycardia) (HCC)    Pain in the chest     PCP: Elliot Charm, MD  REFERRING PROVIDER:  Elliot Charm, MD  REFERRING DIAG: M54.2 (ICD-10-CM) - Cervicalgia Dorsalgia, unspecified [M54.9]   THERAPY DIAG:  Other low back pain  Cervicalgia  Rationale for Evaluation and Treatment: Rehabilitation  ONSET DATE: several years, worsening over past few years  SUBJECTIVE:                                                                                                                                                                                                         SUBJECTIVE STATEMENT: Having times where I am  not thinking about my back.SABRA self massage to lateral hips is very helpful. Neck is still very tight.   Eval: Pt endorses chronic history of neck/back pain that has fluctuated. States he has historically been quite active, including gymnastics when he was younger. Has cardiac history as noted above, but has returned to full activities including resistance training, cardio, and golf.  He notes that over the past few years his neck pain has begun to bother him more without any particular change in activity or MOI. Tendency towards L sided tightness and catching when turning to the L, tends to have transient improvement with use of theragun. He denies any N/T but does endorse infrequent headaches which he feels may be related, are L sided.  He states his back has also bothered him over a similar timeline. He states he has had imaging which shows scoliosis, was given heel lifts to correct apparent leg length discrepancy which he notes may have been somewhat helpful. He has seen a chiropractor in the past with some relief.  Does not endorse any red flags today.  Hand dominance: Right  PERTINENT HISTORY:  hypertrophic cardiomyopathy, NSVT, cardioversion, ICD implant  PAIN:  Are you having pain: tightness in neck at rest, no resting pain in back Location/description: neck L sided tightness, feels a catch when turning ; BIL LBP Best-worst over past week: up to 5/10 for  neck, 7/10 for back   - aggravating factors: lower body dressing, looking to the left, lying down, self distraction (back), golf (back), sitting >1 hr - Easing factors: lying on down on Rside, percussion gun  PRECAUTIONS: ICD  RED FLAGS: None    WEIGHT BEARING RESTRICTIONS: No  FALLS:  Has patient fallen in last 6 months? No  LIVING ENVIRONMENT: Multilevel home, no issues with stairs, lives with family   OCCUPATION: MD - pediatric ICU  PLOF: Independent - enjoys playing golf  PATIENT GOALS: reduction of pain   NEXT MD VISIT: TBD  OBJECTIVE:  Note: Objective measures were completed at Evaluation unless otherwise noted.  DIAGNOSTIC FINDINGS:  No recent imaging in chart - pt reports imaging showing scoliosis 3-4 years ago   PATIENT SURVEYS:  NDI 10/50, 20% NDI 5/17: 7/50   COGNITION: Overall cognitive status: Within functional limits for tasks assessed  SENSATION: Does not endorse any sensory deficits  POSTURE: increased lumbar lordosis, anterior pelvic tilt  PALPATION: Concordant tightness throughout periscapular/lumbar musculature, nonpainful   CERVICAL ROM:   ROM A/PROM (deg) eval 7/1  Flexion full   Extension 50%   Right lateral flexion 25% pinch 40  Left lateral flexion 50% s 30 pinch  Right rotation 45 deg 60  Left rotation 50 deg 60   (Blank rows = not tested) (Key: WFL = within functional limits not formally assessed, * = concordant pain, s = stiffness/stretching sensation, NT = not tested) Comment:    LUMBAR ROM:    A/PROM  eval A/PROM re-eval 04/05   Flexion Able to touch toes  Able to touch toes   Extension 100% 100%  Right lateral flexion    Left lateral flexion    Right rotation 75% s 100%   Left rotation 75% 100%    (Blank rows = not tested) (Key: WFL = within functional limits not formally assessed, * = concordant pain, s = stiffness/stretching sensation, NT = not tested)  Comments:   RANGE OF MOTION:      Right eval  Left eval  Shoulder flexion full Full w/ crepitus  Shoulder abduction 160  150 deg  Functional ER combo    Functional IR combo    Knee extension    Ankle dorsiflexion     (Blank rows = not tested) (Key: WFL = within functional limits not formally assessed, * = concordant pain, s = stiffness/stretching sensation, NT = not tested)  Comments:    STRENGTH TESTING:  MMT Right eval Left eval R/L  04/05  Shoulder flexion 4* 4 *   Shoulder abduction 4 4   Elbow flexion     Elbow extension     Grip strength (gross)     Hip flexion 4 * 3+ *   Hip abduction (modified sitting) 5 5 4+/4- in sidelying   Hip external rotation 4 4   Hip internal rotation 4 4   Knee flexion     Knee extension     Ankle dorsiflexion     Ankle plantarflexion      (Blank rows = not tested) (Key: WFL = within functional limits not formally assessed, * = concordant pain, s = stiffness/stretching sensation, NT = not tested)  Comments: of note, shoulder flex MMT elicits LBP    TREATMENT DATE:  7/1 MANUAL Lt upper trap, levator, suboccipitals, cervical distraction Review of elliptical posture Sheet lateral cervical mobs, first rib mob with upper trap stretch  5/17 Sidelying ITB stretch end of table- STM to Rt gluts when laying on Lt Prone STM to Rt lumbar paraspinals Sidelying hip abd- pillow under Lt side Bridge with tband Roller on lumbar spine Supine core - sacral support, noted improvement in form with slight turnout.   5/3 Suboccipital release STM Lt cervical paraspinals Lt cervical sidebend with facet mobs  Supine core engagement with rib flare reduction Qped triceps- added opp LE ext Triceps & lat pull down in kneeling glut/core engagement Chair pull off bar Planks Upper trap stretch with sheet  4/25 Manual: All PROM performed with distraction to reduce pain and improve movement Trigger point release cervical paraspinals and UT Active release to UT with lateral  flexion Suboccipital release There-ex: UE bike Row machine 3x10 85lbs  Cable pullovers 35lbs 3x10  Cable face pulls 3x10 15lbs    PATIENT EDUCATION:  Education details: Pt education on PT impairments, prognosis, and POC. Informed consent. Rationale for interventions, safe/appropriate HEP performance Person educated: Patient Education method: Explanation, Demonstration, Tactile cues, Verbal cues Education comprehension: verbalized understanding, returned demonstration, verbal cues required, tactile cues required, and needs further education    HOME EXERCISE PROGRAM: Access Code: [email or web address] URL: [email or web address] Date: 12/28/2023 Prepared by: Alm Jenny  Program Notes - please perform 3-3-3 tempo with external rotation, as done in clinic- with march, please use upper body support for balance and maintain core engagement  Exercises - Shoulder External Rotation and Scapular Retraction with Resistance  - 2-3 x daily - 1 sets - 6-8 reps - Standing Hip Flexion March  - 2-3 x daily - 1 sets - 8-10 reps  ASSESSMENT:  CLINICAL IMPRESSION: Thoracic dextro/cervical levoscoliosis resulting in tightness and limited facet mobility in Lt cervical sidebend and upper trap tension in Lt cervical rotation. Worked to improve glut activation on elliptical to even out weight distribution. Encouraged lengthening rather than sidebend focus for scoliosis mobility/stability and continue strengthening program. Reminders for exercises when on vacation. Will f/u in 1 month.   OBJECTIVE IMPAIRMENTS: decreased activity tolerance, decreased endurance, decreased mobility, decreased ROM, decreased strength, postural dysfunction, and pain.   ACTIVITY LIMITATIONS: lifting, bending, sitting,  standing, and sleeping  PARTICIPATION LIMITATIONS: community activity and occupation  PERSONAL FACTORS: Time since onset of injury/illness/exacerbation and 3+ comorbidities: cardiomyopathy, NSVT,  ICD are also affecting patient's functional outcome.   REHAB POTENTIAL: Good  CLINICAL DECISION MAKING: Evolving/moderate complexity  EVALUATION COMPLEXITY: Moderate   GOALS:  SHORT TERM GOALS: Target date: 01/25/2024  Pt will demonstrate appropriate understanding and performance of initially prescribed HEP in order to facilitate improved independence with management of symptoms.  Baseline: HEP established  Goal status: INITIAL   2. Pt will report at least 25% improvement in overall pain levels over past week in order to facilitate improved tolerance to typical daily activities.   Baseline: up to 5/10 neck, 7/10 back  Goal status: INITIAL    LONG TERM GOALS: Target date: 02/22/2024  Pt will score less than or equal to 5% on NDI in order to demonstrate improved perception of function due to symptoms (MDC 10-13 pts per Neysa dunker al 2009, 2010). Baseline: 20% Goal status: INITIAL  2. Pt will demonstrate bilateral cervical rotation AROM of at least 55 deg bilaterally in order to facilitate improved comfort w/ functional movements.  Baseline: see ROM chart above Goal status: INITIAL  3. Pt will demonstrate at least 4+/5 global shoulder/hip MMT for improved symmetry of UE strength and improved tolerance to functional movements.  Baseline: see MMT chart above Goal status: INITIAL   4. Pt will report/demonstrate ability to sit for greater than 2 hrs with less than 2 point increase on NPS in order to demonstrate improved tolerance to occupational/daily tasks. Baseline: increased pain w/ sitting > 1 hr Goal status: INITIAL   5. Pt will report at least 50% decrease in overall pain levels in past week in order to facilitate improved tolerance to basic ADLs/mobility.   Baseline: up to 5/10 neck, 7/10 back   Goal status: INITIAL    6. Pt will demonstrate appropriate performance of final prescribed HEP in order to facilitate improved self-management of symptoms post-discharge.   Baseline:  initial HEP prescribed  Goal status: INITIAL     PLAN:  PT FREQUENCY: 1-2x/week  PT DURATION: 8 weeks  PLANNED INTERVENTIONS: 97164- PT Re-evaluation, 97110-Therapeutic exercises, 97530- Therapeutic activity, 97112- Neuromuscular re-education, 97535- Self Care, [id number]- Manual therapy, Patient/Family education, Balance training, Stair training, Taping, Dry Needling, Joint mobilization, Spinal mobilization, Cryotherapy, and Moist heat  PLAN FOR NEXT SESSION: Review/update HEP PRN. Work on [employer] exercises as appropriate with emphasis on core stability, postural endurance, and motor control. Symptom modification strategies as indicated/appropriate. Mindful of ICD and cardiac history.     Chattie Greeson C. Posey Jasmin PT, DPT 04/18/24 4:38 PM

## 2024-05-08 NOTE — Addendum Note (Signed)
 Addended by: VICCI SELLER A on: 05/08/2024 02:17 PM   Modules accepted: Orders

## 2024-05-08 NOTE — Progress Notes (Signed)
 Remote ICD transmission.

## 2024-05-20 ENCOUNTER — Ambulatory Visit (HOSPITAL_BASED_OUTPATIENT_CLINIC_OR_DEPARTMENT_OTHER): Attending: Internal Medicine | Admitting: Physical Therapy

## 2024-05-20 ENCOUNTER — Encounter (HOSPITAL_BASED_OUTPATIENT_CLINIC_OR_DEPARTMENT_OTHER): Payer: Self-pay | Admitting: Physical Therapy

## 2024-05-20 DIAGNOSIS — M5459 Other low back pain: Secondary | ICD-10-CM | POA: Diagnosis not present

## 2024-05-20 DIAGNOSIS — M542 Cervicalgia: Secondary | ICD-10-CM | POA: Diagnosis not present

## 2024-05-20 NOTE — Therapy (Signed)
 OUTPATIENT PHYSICAL THERAPY     Patient Name: Bradley JANIS, MD MRN: [id number] DOB:Oct 26, 1970, 53 y.o., male Today's Date: 05/20/2024  END OF SESSION:  PT End of Session - 05/20/24 0747     Visit Number 9    Number of Visits 17    Date for PT Re-Evaluation 08/20/24    Authorization Type [facility] employee    PT Start Time [phone]    PT Stop Time 0827    PT Time Calculation (min) 39 min    Activity Tolerance Patient tolerated treatment well;No increased pain    Behavior During Therapy WFL for tasks assessed/performed               Past Medical History:  Diagnosis Date   Apical variant hypertrophic cardiomyopathy (HCC)    Elevated troponin    Hypercholesterolemia    NSVT (nonsustained ventricular tachycardia) (HCC)    Past Surgical History:  Procedure Laterality Date   CARDIOVERSION N/A 05/14/2023   Procedure: CARDIOVERSION;  Surgeon: Pietro Redell RAMAN, MD;  Location: [facility] INVASIVE CV LAB;  Service: Cardiovascular;  Laterality: N/A;   EP IMPLANTABLE DEVICE N/A 07/15/2016   Procedure: SubQ ICD Implant;  Surgeon: Elspeth JAYSON Sage, MD;  Location: [facility] INVASIVE CV LAB;  Service: Cardiovascular;  Laterality: N/A;   KNEE SURGERY     SUBQ ICD CHANGEOUT N/A 11/17/2021   Procedure: SUBQ ICD CHANGEOUT;  Surgeon: Sage Elspeth JAYSON, MD;  Location: [facility] INVASIVE CV LAB;  Service: Cardiovascular;  Laterality: N/A;   Patient Active Problem List   Diagnosis Date Noted   Atrial fibrillation (HCC) 03/17/2022   ICD (implantable cardioverter-defibrillator) in place 10/22/2019   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) 07/15/2016   Shoulder pain 06/04/2016   Apical variant hypertrophic cardiomyopathy (HCC) 11/09/2015   AIVR (accelerated idioventricular rhythm) (HCC) 11/09/2015   Elevated troponin 11/09/2015   Chest pain 11/08/2015   NSVT (nonsustained ventricular tachycardia) (HCC)    Pain in the chest     PCP: Elliot Charm, MD  REFERRING PROVIDER:  Elliot Charm, MD  REFERRING DIAG: M54.2 (ICD-10-CM) - Cervicalgia Dorsalgia, unspecified [M54.9]   THERAPY DIAG:  Other low back pain  Cervicalgia  Rationale for Evaluation and Treatment: Rehabilitation  ONSET DATE: several years, worsening over past few years  SUBJECTIVE:                                                                                                                                                                                                         SUBJECTIVE STATEMENT: Slept on soft mattresses while  traveling so a little backtrack. Neck is just really tight.   Eval: Pt endorses chronic history of neck/back pain that has fluctuated. States he has historically been quite active, including gymnastics when he was younger. Has cardiac history as noted above, but has returned to full activities including resistance training, cardio, and golf.  He notes that over the past few years his neck pain has begun to bother him more without any particular change in activity or MOI. Tendency towards L sided tightness and catching when turning to the L, tends to have transient improvement with use of theragun. He denies any N/T but does endorse infrequent headaches which he feels may be related, are L sided.  He states his back has also bothered him over a similar timeline. He states he has had imaging which shows scoliosis, was given heel lifts to correct apparent leg length discrepancy which he notes may have been somewhat helpful. He has seen a chiropractor in the past with some relief.  Does not endorse any red flags today.  Hand dominance: Right  PERTINENT HISTORY:  hypertrophic cardiomyopathy, NSVT, cardioversion, ICD implant  PAIN:  Are you having pain: tightness in neck at rest, no resting pain in back Location/description: neck L sided tightness, feels a catch when turning ; BIL LBP Best-worst over past week: up to 5/10 for neck, 7/10 for back   - aggravating  factors: lower body dressing, looking to the left, lying down, self distraction (back), golf (back), sitting >1 hr - Easing factors: lying on down on Rside, percussion gun  PRECAUTIONS: ICD  RED FLAGS: None    WEIGHT BEARING RESTRICTIONS: No  FALLS:  Has patient fallen in last 6 months? No  LIVING ENVIRONMENT: Multilevel home, no issues with stairs, lives with family   OCCUPATION: MD - pediatric ICU  PLOF: Independent - enjoys playing golf  PATIENT GOALS: reduction of pain   NEXT MD VISIT: TBD  OBJECTIVE:  Note: Objective measures were completed at Evaluation unless otherwise noted.  DIAGNOSTIC FINDINGS:  No recent imaging in chart - pt reports imaging showing scoliosis 3-4 years ago   PATIENT SURVEYS:  NDI 10/50, 20% NDI 5/17: 7/50   COGNITION: Overall cognitive status: Within functional limits for tasks assessed  SENSATION: Does not endorse any sensory deficits  POSTURE: increased lumbar lordosis, anterior pelvic tilt  PALPATION: Concordant tightness throughout periscapular/lumbar musculature, nonpainful   CERVICAL ROM:  noted in daily Tx lists  ROM A/PROM (deg) eval 7/1  Flexion full   Extension 50%   Right lateral flexion 25% pinch 40  Left lateral flexion 50% s 30 pinch  Right rotation 45 deg 60  Left rotation 50 deg 60   (Blank rows = not tested) (Key: WFL = within functional limits not formally assessed, * = concordant pain, s = stiffness/stretching sensation, NT = not tested) Comment:    LUMBAR ROM:    A/PROM  eval A/PROM re-eval 04/05   Flexion Able to touch toes  Able to touch toes   Extension 100% 100%  Right lateral flexion    Left lateral flexion    Right rotation 75% s 100%   Left rotation 75% 100%    (Blank rows = not tested) (Key: WFL = within functional limits not formally assessed, * = concordant pain, s = stiffness/stretching sensation, NT = not tested)  Comments:   RANGE OF MOTION:      Right eval Left eval  Shoulder  flexion full Full w/ crepitus  Shoulder abduction 160  150 deg  Functional ER combo    Functional IR combo    Knee extension    Ankle dorsiflexion     (Blank rows = not tested) (Key: WFL = within functional limits not formally assessed, * = concordant pain, s = stiffness/stretching sensation, NT = not tested)  Comments:    STRENGTH TESTING:  MMT Right eval Left eval R/L  04/05  Shoulder flexion 4* 4 *   Shoulder abduction 4 4   Elbow flexion     Elbow extension     Grip strength (gross)     Hip flexion 4 * 3+ *   Hip abduction (modified sitting) 5 5 4+/4- in sidelying   Hip external rotation 4 4   Hip internal rotation 4 4   Knee flexion     Knee extension     Ankle dorsiflexion     Ankle plantarflexion      (Blank rows = not tested) (Key: WFL = within functional limits not formally assessed, * = concordant pain, s = stiffness/stretching sensation, NT = not tested)  Comments: of note, shoulder flex MMT elicits LBP    TREATMENT DATE:  8/2 Beginning cervical AROM 50 Rt  30 Lt- pinching on Lt side Cat/cow Open book Suboccipital release with focus on the right, paired with Lt to Rt upper cervical glides STM Lt scalenes, SCM Passive cervical sidebend Chin tuck+lift Lateral cervical mobs with towel pulling Lt to Rt Post session sidebend 46 Rt 44 Lt   7/1 MANUAL Lt upper trap, levator, suboccipitals, cervical distraction Review of elliptical posture Sheet lateral cervical mobs, first rib mob with upper trap stretch  5/17 Sidelying ITB stretch end of table- STM to Rt gluts when laying on Lt Prone STM to Rt lumbar paraspinals Sidelying hip abd- pillow under Lt side Bridge with tband Roller on lumbar spine Supine core - sacral support, noted improvement in form with slight turnout.   5/3 Suboccipital release STM Lt cervical paraspinals Lt cervical sidebend with facet mobs  Supine core engagement with rib flare reduction Qped triceps- added opp LE ext Triceps &  lat pull down in kneeling glut/core engagement Chair pull off bar Planks Upper trap stretch with sheet  4/25 Manual: All PROM performed with distraction to reduce pain and improve movement Trigger point release cervical paraspinals and UT Active release to UT with lateral flexion Suboccipital release There-ex: UE bike Row machine 3x10 85lbs  Cable pullovers 35lbs 3x10  Cable face pulls 3x10 15lbs    PATIENT EDUCATION:  Education details: Pt education on PT impairments, prognosis, and POC. Informed consent. Rationale for interventions, safe/appropriate HEP performance Person educated: Patient Education method: Explanation, Demonstration, Tactile cues, Verbal cues Education comprehension: verbalized understanding, returned demonstration, verbal cues required, tactile cues required, and needs further education    HOME EXERCISE PROGRAM: Access Code: [email or web address] URL: [email or web address] Date: 12/28/2023 Prepared by: Alm Jenny  Program Notes - please perform 3-3-3 tempo with external rotation, as done in clinic- with march, please use upper body support for balance and maintain core engagement  Exercises - Shoulder External Rotation and Scapular Retraction with Resistance  - 2-3 x daily - 1 sets - 6-8 reps - Standing Hip Flexion March  - 2-3 x daily - 1 sets - 8-10 reps  ASSESSMENT:  CLINICAL IMPRESSION: Able to improve equality in sidebend through cervical region resulting in decreased impingement. Limited thoracic mobility. Has been experiencing distal symptoms into Lt UE inconsistently since last visit but also  did a lot of driving and sleeping in hotels. Pt will continue to benefit from PT to address hypermobility and compensatory patterns to avoid further damage to the spine. Pt works 2 weeks on/2 weeks off so will have to plan visits around that schedule.    OBJECTIVE IMPAIRMENTS: decreased activity tolerance, decreased endurance,  decreased mobility, decreased ROM, decreased strength, postural dysfunction, and pain.   ACTIVITY LIMITATIONS: lifting, bending, sitting, standing, and sleeping  PARTICIPATION LIMITATIONS: community activity and occupation  PERSONAL FACTORS: Time since onset of injury/illness/exacerbation and 3+ comorbidities: cardiomyopathy, NSVT, ICD are also affecting patient's functional outcome.   REHAB POTENTIAL: Good  CLINICAL DECISION MAKING: Evolving/moderate complexity  EVALUATION COMPLEXITY: Moderate   GOALS:  SHORT TERM GOALS: Target date: 01/25/2024  Pt will demonstrate appropriate understanding and performance of initially prescribed HEP in order to facilitate improved independence with management of symptoms.  Baseline: HEP established  Goal status: MET   2. Pt will report at least 25% improvement in overall pain levels over past week in order to facilitate improved tolerance to typical daily activities.   Baseline: inconsistent pain depending on activity but is able to utilize HEP to keep pain manageable.   Goal status: MET    LONG TERM GOALS: Target date: POC date  Pt will score less than or equal to 5% on NDI in order to demonstrate improved perception of function due to symptoms (MDC 10-13 pts per Neysa dunker al 2009, 2010). Baseline: 20% Goal status: INITIAL  2. Pt will demonstrate bilateral cervical rotation AROM of at least 55 deg bilaterally in order to facilitate improved comfort w/ functional movements.  Baseline: see ROM chart above Goal status: ongoing  3. Pt will demonstrate at least 4+/5 global shoulder/hip MMT for improved symmetry of UE strength and improved tolerance to functional movements.  Baseline: see MMT chart above Goal status: INITIAL   4. Pt will report/demonstrate ability to sit for greater than 2 hrs with less than 2 point increase on NPS in order to demonstrate improved tolerance to occupational/daily tasks. Baseline: increased pain w/ sitting > 1  hr Goal status: INITIAL   5. Pt will report at least 50% decrease in overall pain levels in past week in order to facilitate improved tolerance to basic ADLs/mobility.   Baseline: up to 5/10 neck, 7/10 back   Goal status: INITIAL    6. Pt will demonstrate appropriate performance of final prescribed HEP in order to facilitate improved self-management of symptoms post-discharge.   Baseline: initial HEP prescribed  Goal status: INITIAL     PLAN:  PT FREQUENCY: 1-2x/week  PT DURATION: 8 weeks  PLANNED INTERVENTIONS: 97164- PT Re-evaluation, 97110-Therapeutic exercises, 97530- Therapeutic activity, 97112- Neuromuscular re-education, 97535- Self Care, [id number]- Manual therapy, Patient/Family education, Balance training, Stair training, Taping, Dry Needling, Joint mobilization, Spinal mobilization, Cryotherapy, and Moist heat  PLAN FOR NEXT SESSION:  Mindful of ICD and cardiac history. Thoracic mobility & strength while creating stability through core and DNFs    Lakisa Lotz C. Izreal Kock PT, DPT 05/20/24 8:37 AM

## 2024-05-30 ENCOUNTER — Ambulatory Visit (HOSPITAL_COMMUNITY)
Admission: RE | Admit: 2024-05-30 | Discharge: 2024-05-30 | Disposition: A | Discharge status: Home / Self Care | Source: Ambulatory Visit | Attending: Cardiology | Admitting: Cardiology

## 2024-05-30 DIAGNOSIS — I422 Other hypertrophic cardiomyopathy: Secondary | ICD-10-CM | POA: Diagnosis not present

## 2024-05-30 DIAGNOSIS — I480 Paroxysmal atrial fibrillation: Secondary | ICD-10-CM | POA: Diagnosis not present

## 2024-05-30 DIAGNOSIS — D6869 Other thrombophilia: Secondary | ICD-10-CM | POA: Insufficient documentation

## 2024-05-30 DIAGNOSIS — Z95810 Presence of automatic (implantable) cardiac defibrillator: Secondary | ICD-10-CM | POA: Insufficient documentation

## 2024-05-30 DIAGNOSIS — I4729 Other ventricular tachycardia: Secondary | ICD-10-CM | POA: Insufficient documentation

## 2024-05-30 LAB — ECHOCARDIOGRAM COMPLETE
Area-P 1/2: 4.54 cm2
S' Lateral: 2.7 cm

## 2024-05-31 ENCOUNTER — Ambulatory Visit: Payer: Self-pay | Admitting: Cardiology

## 2024-06-07 ENCOUNTER — Ambulatory Visit (HOSPITAL_BASED_OUTPATIENT_CLINIC_OR_DEPARTMENT_OTHER): Admitting: Physical Therapy

## 2024-06-07 ENCOUNTER — Encounter (HOSPITAL_BASED_OUTPATIENT_CLINIC_OR_DEPARTMENT_OTHER): Payer: Self-pay | Admitting: Physical Therapy

## 2024-06-07 DIAGNOSIS — M542 Cervicalgia: Secondary | ICD-10-CM

## 2024-06-07 DIAGNOSIS — M5459 Other low back pain: Secondary | ICD-10-CM

## 2024-06-07 NOTE — Therapy (Unsigned)
 OUTPATIENT PHYSICAL THERAPY     Patient Name: Bradley REMMERT, MD MRN: [id number] DOB:[birth year], 53 y.o., male Today's Date: 06/08/2024  END OF SESSION:  PT End of Session - 06/07/24 1016     Visit Number 10    Number of Visits 17    Date for PT Re-Evaluation 08/20/24    Authorization Type [facility] employee    PT Start Time 1015    PT Stop Time 1036    PT Time Calculation (min) 21 min    Activity Tolerance Patient tolerated treatment well;No increased pain    Behavior During Therapy WFL for tasks assessed/performed               Past Medical History:  Diagnosis Date   Apical variant hypertrophic cardiomyopathy (HCC)    Elevated troponin    Hypercholesterolemia    NSVT (nonsustained ventricular tachycardia) (HCC)    Past Surgical History:  Procedure Laterality Date   CARDIOVERSION N/A 05/14/2023   Procedure: CARDIOVERSION;  Surgeon: Pietro Redell RAMAN, MD;  Location: [facility] INVASIVE CV LAB;  Service: Cardiovascular;  Laterality: N/A;   EP IMPLANTABLE DEVICE N/A 07/15/2016   Procedure: SubQ ICD Implant;  Surgeon: Elspeth JAYSON Sage, MD;  Location: [facility] INVASIVE CV LAB;  Service: Cardiovascular;  Laterality: N/A;   KNEE SURGERY     SUBQ ICD CHANGEOUT N/A 11/17/2021   Procedure: SUBQ ICD CHANGEOUT;  Surgeon: Sage Elspeth JAYSON, MD;  Location: [facility] INVASIVE CV LAB;  Service: Cardiovascular;  Laterality: N/A;   Patient Active Problem List   Diagnosis Date Noted   Atrial fibrillation (HCC) 03/17/2022   ICD (implantable cardioverter-defibrillator) in place 10/22/2019   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) 07/15/2016   Shoulder pain 06/04/2016   Apical variant hypertrophic cardiomyopathy (HCC) 11/09/2015   AIVR (accelerated idioventricular rhythm) (HCC) 11/09/2015   Elevated troponin 11/09/2015   Chest pain 11/08/2015   NSVT (nonsustained ventricular tachycardia) (HCC)    Pain in the chest     PCP: Elliot Charm, MD  REFERRING PROVIDER:  Elliot Charm, MD  REFERRING DIAG: M54.2 (ICD-10-CM) - Cervicalgia Dorsalgia, unspecified [M54.9]   THERAPY DIAG:  Other low back pain  Cervicalgia  Rationale for Evaluation and Treatment: Rehabilitation  ONSET DATE: several years, worsening over past few years  SUBJECTIVE:                                                                                                                                                                                                         SUBJECTIVE STATEMENT: Was able to move kid  into apartment, play golf- fine with large swings, some pain at contact in chipping. Denies UE symptoms.   Eval: Pt endorses chronic history of neck/back pain that has fluctuated. States he has historically been quite active, including gymnastics when he was younger. Has cardiac history as noted above, but has returned to full activities including resistance training, cardio, and golf.  He notes that over the past few years his neck pain has begun to bother him more without any particular change in activity or MOI. Tendency towards L sided tightness and catching when turning to the L, tends to have transient improvement with use of theragun. He denies any N/T but does endorse infrequent headaches which he feels may be related, are L sided.  He states his back has also bothered him over a similar timeline. He states he has had imaging which shows scoliosis, was given heel lifts to correct apparent leg length discrepancy which he notes may have been somewhat helpful. He has seen a chiropractor in the past with some relief.  Does not endorse any red flags today.  Hand dominance: Right  PERTINENT HISTORY:  hypertrophic cardiomyopathy, NSVT, cardioversion, ICD implant  PAIN:  Are you having pain: tightness in neck at rest, no resting pain in back Location/description: neck L sided tightness, feels a catch when turning ; BIL LBP Best-worst over past week: up to 5/10 for  neck, 7/10 for back   - aggravating factors: lower body dressing, looking to the left, lying down, self distraction (back), golf (back), sitting >1 hr - Easing factors: lying on down on Rside, percussion gun  PRECAUTIONS: ICD  RED FLAGS: None    WEIGHT BEARING RESTRICTIONS: No  FALLS:  Has patient fallen in last 6 months? No  LIVING ENVIRONMENT: Multilevel home, no issues with stairs, lives with family   OCCUPATION: MD - pediatric ICU  PLOF: Independent - enjoys playing golf  PATIENT GOALS: reduction of pain   NEXT MD VISIT: TBD  OBJECTIVE:  Note: Objective measures were completed at Evaluation unless otherwise noted.  DIAGNOSTIC FINDINGS:  No recent imaging in chart - pt reports imaging showing scoliosis 3-4 years ago   PATIENT SURVEYS:  NDI 10/50, 20% NDI 5/17: 7/50   COGNITION: Overall cognitive status: Within functional limits for tasks assessed  SENSATION: Does not endorse any sensory deficits  POSTURE: increased lumbar lordosis, anterior pelvic tilt  PALPATION: Concordant tightness throughout periscapular/lumbar musculature, nonpainful   CERVICAL ROM:  noted in daily Tx lists  ROM A/PROM (deg) eval 7/1 8/20  Flexion full    Extension 50%    Right lateral flexion 25% pinch 40 40  Left lateral flexion 50% s 30 pinch 36 pinch  Right rotation 45 deg 60   Left rotation 50 deg 60    (Blank rows = not tested) (Key: WFL = within functional limits not formally assessed, * = concordant pain, s = stiffness/stretching sensation, NT = not tested) Comment:    LUMBAR ROM:    A/PROM  eval A/PROM re-eval 04/05   Flexion Able to touch toes  Able to touch toes   Extension 100% 100%  Right lateral flexion    Left lateral flexion    Right rotation 75% s 100%   Left rotation 75% 100%    (Blank rows = not tested) (Key: WFL = within functional limits not formally assessed, * = concordant pain, s = stiffness/stretching sensation, NT = not tested)  Comments:    RANGE OF MOTION:  Right eval Left eval  Shoulder flexion full Full w/ crepitus  Shoulder abduction 160  150 deg  Functional ER combo    Functional IR combo    Knee extension    Ankle dorsiflexion     (Blank rows = not tested) (Key: WFL = within functional limits not formally assessed, * = concordant pain, s = stiffness/stretching sensation, NT = not tested)  Comments:    STRENGTH TESTING:  MMT Right eval Left eval R/L  04/05 Rt/Lt 8/20  Shoulder flexion 4* 4 *    Shoulder abduction 4 4    Elbow flexion      Elbow extension      Grip strength (gross)      Hip flexion 4 * 3+ *    Hip abduction (modified sitting) 5 5 4+/4- in sidelying  55.4/49.4  Hip external rotation 4 4    Hip internal rotation 4 4    Knee flexion      Knee extension      Ankle dorsiflexion      Ankle plantarflexion       (Blank rows = not tested) (Key: WFL = within functional limits not formally assessed, * = concordant pain, s = stiffness/stretching sensation, NT = not tested)  Comments: of note, shoulder flex MMT elicits LBP    TREATMENT DATE:  8/2 Beginning cervical AROM 50 Rt  30 Lt- pinching on Lt side Cat/cow Open book Suboccipital release with focus on the right, paired with Lt to Rt upper cervical glides STM Lt scalenes, SCM Passive cervical sidebend Chin tuck+lift Lateral cervical mobs with towel pulling Lt to Rt Post session sidebend 46 Rt 44 Lt   7/1 MANUAL Lt upper trap, levator, suboccipitals, cervical distraction Review of elliptical posture Sheet lateral cervical mobs, first rib mob with upper trap stretch  5/17 Sidelying ITB stretch end of table- STM to Rt gluts when laying on Lt Prone STM to Rt lumbar paraspinals Sidelying hip abd- pillow under Lt side Bridge with tband Roller on lumbar spine Supine core - sacral support, noted improvement in form with slight turnout.   5/3 Suboccipital release STM Lt cervical paraspinals Lt cervical sidebend with facet  mobs  Supine core engagement with rib flare reduction Qped triceps- added opp LE ext Triceps & lat pull down in kneeling glut/core engagement Chair pull off bar Planks Upper trap stretch with sheet  4/25 Manual: All PROM performed with distraction to reduce pain and improve movement Trigger point release cervical paraspinals and UT Active release to UT with lateral flexion Suboccipital release There-ex: UE bike Row machine 3x10 85lbs  Cable pullovers 35lbs 3x10  Cable face pulls 3x10 15lbs    PATIENT EDUCATION:  Education details: Pt education on PT impairments, prognosis, and POC. Informed consent. Rationale for interventions, safe/appropriate HEP performance Person educated: Patient Education method: Explanation, Demonstration, Tactile cues, Verbal cues Education comprehension: verbalized understanding, returned demonstration, verbal cues required, tactile cues required, and needs further education    HOME EXERCISE PROGRAM: Access Code: [email or web address] URL: [email or web address]   ASSESSMENT:  CLINICAL IMPRESSION: Time spent discussing importance of long-term maintenance of strength and flexibility as well as awareness of onset of pain/tightness. Pt is prepared for d/c to independent program at this time and was encouraged to reach out with any further questions.    OBJECTIVE IMPAIRMENTS: decreased activity tolerance, decreased endurance, decreased mobility, decreased ROM, decreased strength, postural dysfunction, and pain.   ACTIVITY LIMITATIONS: lifting, bending, sitting, standing, and sleeping  PARTICIPATION LIMITATIONS: community activity and occupation  PERSONAL FACTORS: Time since onset of injury/illness/exacerbation and 3+ comorbidities: cardiomyopathy, NSVT, ICD are also affecting patient's functional outcome.   REHAB POTENTIAL: Good  CLINICAL DECISION MAKING: Evolving/moderate complexity  EVALUATION COMPLEXITY:  Moderate   GOALS:  SHORT TERM GOALS: Target date: 01/25/2024  Pt will demonstrate appropriate understanding and performance of initially prescribed HEP in order to facilitate improved independence with management of symptoms.  Baseline: HEP established  Goal status: MET   2. Pt will report at least 25% improvement in overall pain levels over past week in order to facilitate improved tolerance to typical daily activities.   Baseline: inconsistent pain depending on activity but is able to utilize HEP to keep pain manageable.   Goal status: MET    LONG TERM GOALS: Target date: POC date  Pt will score less than or equal to 5% on NDI in order to demonstrate improved perception of function due to symptoms (MDC 10-13 pts per Neysa dunker al 2009, 2010). Baseline: 20% Goal status: MET  2. Pt will demonstrate bilateral cervical rotation AROM of at least 55 deg bilaterally in order to facilitate improved comfort w/ functional movements.  Baseline: see ROM chart above Goal status: ongoing  3. Pt will demonstrate at least 4+/5 global shoulder/hip MMT for improved symmetry of UE strength and improved tolerance to functional movements.  Baseline: see MMT chart above Goal status: MET   4. Pt will report/demonstrate ability to sit for greater than 2 hrs with less than 2 point increase on NPS in order to demonstrate improved tolerance to occupational/daily tasks. Baseline: increased pain w/ sitting > 1 hr Goal status: Defer   5. Pt will report at least 50% decrease in overall pain levels in past week in order to facilitate improved tolerance to basic ADLs/mobility.   Baseline: back lingers at a bout a 4, can get up to 8-9/10 for 10s; up to 7/10 when turning to the left- feels like a kink  Goal status: Met    6. Pt will demonstrate appropriate performance of final prescribed HEP in order to facilitate improved self-management of symptoms post-discharge.   Baseline: initial HEP prescribed  Goal status:  MET     PLAN:  PT FREQUENCY: 1-2x/week  PT DURATION: 8 weeks  PLANNED INTERVENTIONS: 97164- PT Re-evaluation, 97110-Therapeutic exercises, 97530- Therapeutic activity, 97112- Neuromuscular re-education, 97535- Self Care, [id number]- Manual therapy, Patient/Family education, Balance training, Stair training, Taping, Dry Needling, Joint mobilization, Spinal mobilization, Cryotherapy, and Moist heat      Keiffer Piper C. Maleia Weems PT, DPT 06/08/24 2:43 PM

## 2024-06-11 ENCOUNTER — Other Ambulatory Visit: Payer: Self-pay | Admitting: Internal Medicine

## 2024-06-11 DIAGNOSIS — I4891 Unspecified atrial fibrillation: Secondary | ICD-10-CM

## 2024-06-12 ENCOUNTER — Other Ambulatory Visit (HOSPITAL_BASED_OUTPATIENT_CLINIC_OR_DEPARTMENT_OTHER): Payer: Self-pay

## 2024-06-12 MED ORDER — APIXABAN 5 MG PO TABS
5.0000 mg | ORAL_TABLET | Freq: Two times a day (BID) | ORAL | 3 refills | Status: AC
Start: 2024-06-12 — End: ?
  Filled 2024-06-12: qty 180, 90d supply, fill #0
  Filled 2024-09-06: qty 180, 90d supply, fill #1

## 2024-06-12 NOTE — Telephone Encounter (Signed)
 Prescription refill request for Eliquis  received. Indication: a fib Last office visit: 04/04/24 Scr: 1.35 epic 03/18/24 Age: 53 Weight: 72kg

## 2024-06-14 ENCOUNTER — Ambulatory Visit (INDEPENDENT_AMBULATORY_CARE_PROVIDER_SITE_OTHER): Payer: No Typology Code available for payment source

## 2024-06-14 DIAGNOSIS — I422 Other hypertrophic cardiomyopathy: Secondary | ICD-10-CM | POA: Diagnosis not present

## 2024-06-21 ENCOUNTER — Ambulatory Visit: Payer: Self-pay | Admitting: Cardiology

## 2024-06-21 LAB — CUP PACEART REMOTE DEVICE CHECK
Battery Remaining Percentage: 72 %
Date Time Interrogation Session: 20250829200400
HighPow Impedance: 60 Ohm
Implantable Lead Connection Status: 753985
Implantable Lead Implant Date: 20170927
Implantable Lead Location: 753862
Implantable Lead Model: 3401
Implantable Pulse Generator Implant Date: 20230130
Pulse Gen Serial Number: 171972

## 2024-06-23 NOTE — Progress Notes (Signed)
Remote ICD Transmission.

## 2024-07-18 ENCOUNTER — Other Ambulatory Visit (HOSPITAL_BASED_OUTPATIENT_CLINIC_OR_DEPARTMENT_OTHER): Payer: Self-pay

## 2024-08-07 ENCOUNTER — Other Ambulatory Visit (HOSPITAL_BASED_OUTPATIENT_CLINIC_OR_DEPARTMENT_OTHER): Payer: Self-pay

## 2024-08-07 MED ORDER — FLUZONE 0.5 ML IM SUSY
0.5000 mL | PREFILLED_SYRINGE | Freq: Once | INTRAMUSCULAR | 0 refills | Status: AC
  Filled 2024-08-07: qty 0.5, 1d supply, fill #0

## 2024-08-25 ENCOUNTER — Ambulatory Visit: Admitting: Student

## 2024-09-13 ENCOUNTER — Ambulatory Visit: Payer: No Typology Code available for payment source

## 2024-09-13 DIAGNOSIS — I422 Other hypertrophic cardiomyopathy: Secondary | ICD-10-CM | POA: Diagnosis not present

## 2024-09-15 LAB — CUP PACEART REMOTE DEVICE CHECK
Battery Remaining Percentage: 69 %
Date Time Interrogation Session: 20251126123900
HighPow Impedance: 60 Ohm
Implantable Lead Connection Status: 753985
Implantable Lead Implant Date: 20170927
Implantable Lead Location: 753862
Implantable Lead Model: 3401
Implantable Pulse Generator Implant Date: 20230130
Pulse Gen Serial Number: 171972

## 2024-09-18 ENCOUNTER — Ambulatory Visit: Payer: Self-pay | Admitting: Cardiology

## 2024-09-18 NOTE — Progress Notes (Signed)
 Remote ICD Transmission

## 2024-09-20 ENCOUNTER — Other Ambulatory Visit (HOSPITAL_BASED_OUTPATIENT_CLINIC_OR_DEPARTMENT_OTHER): Payer: Self-pay

## 2024-09-20 MED ORDER — COMIRNATY 30 MCG/0.3ML IM SUSY
0.3000 mL | PREFILLED_SYRINGE | Freq: Once | INTRAMUSCULAR | 0 refills | Status: AC
  Filled 2024-09-20: qty 0.3, 1d supply, fill #0

## 2024-10-04 ENCOUNTER — Other Ambulatory Visit (HOSPITAL_BASED_OUTPATIENT_CLINIC_OR_DEPARTMENT_OTHER): Payer: Self-pay

## 2024-10-04 MED ORDER — MOXIFLOXACIN HCL 0.5 % OP SOLN
1.0000 [drp] | Freq: Four times a day (QID) | OPHTHALMIC | 0 refills | Status: AC
  Filled 2024-10-04: qty 3, 15d supply, fill #0

## 2024-11-07 ENCOUNTER — Encounter (HOSPITAL_BASED_OUTPATIENT_CLINIC_OR_DEPARTMENT_OTHER): Payer: Self-pay | Admitting: Physical Therapy
